# Patient Record
Sex: Male | Born: 1955 | Race: White | Hispanic: No | Marital: Married | Smoking: Never smoker
Health system: Southern US, Community
[De-identification: ages and names within clinical notes are randomized; demographics above are authoritative.]

## PROBLEM LIST (undated history)

## (undated) DIAGNOSIS — M1990 Unspecified osteoarthritis, unspecified site: Secondary | ICD-10-CM

## (undated) DIAGNOSIS — T4145XA Adverse effect of unspecified anesthetic, initial encounter: Secondary | ICD-10-CM

## (undated) DIAGNOSIS — E039 Hypothyroidism, unspecified: Secondary | ICD-10-CM

## (undated) DIAGNOSIS — T8859XA Other complications of anesthesia, initial encounter: Secondary | ICD-10-CM

## (undated) DIAGNOSIS — T883XXA Malignant hyperthermia due to anesthesia, initial encounter: Secondary | ICD-10-CM

## (undated) DIAGNOSIS — K5730 Diverticulosis of large intestine without perforation or abscess without bleeding: Secondary | ICD-10-CM

## (undated) HISTORY — DX: Hypothyroidism, unspecified: E03.9

## (undated) HISTORY — DX: Diverticulosis of large intestine without perforation or abscess without bleeding: K57.30

## (undated) HISTORY — PX: ANTERIOR CRUCIATE LIGAMENT REPAIR: SHX115

---
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
## 1978-10-01 HISTORY — PX: BACK SURGERY: SHX140

## 1982-10-01 HISTORY — PX: KNEE ARTHROSCOPY: SUR90

## 1992-10-01 HISTORY — PX: ANTERIOR CRUCIATE LIGAMENT REPAIR: SHX115

## 1997-05-10 ENCOUNTER — Encounter: Payer: Self-pay | Admitting: Internal Medicine

## 1999-10-02 HISTORY — PX: LASIK: SHX215

## 2002-10-22 ENCOUNTER — Encounter: Payer: Self-pay | Admitting: Internal Medicine

## 2005-11-21 ENCOUNTER — Ambulatory Visit: Payer: Self-pay | Admitting: Internal Medicine

## 2006-02-05 ENCOUNTER — Ambulatory Visit: Payer: Self-pay | Admitting: Family Medicine

## 2006-02-18 ENCOUNTER — Ambulatory Visit: Payer: Self-pay | Admitting: Internal Medicine

## 2006-02-22 ENCOUNTER — Ambulatory Visit: Payer: Self-pay | Admitting: Internal Medicine

## 2006-03-07 ENCOUNTER — Ambulatory Visit: Payer: Self-pay | Admitting: Internal Medicine

## 2006-03-07 LAB — HM COLONOSCOPY

## 2008-10-01 HISTORY — PX: NASAL SINUS SURGERY: SHX719

## 2008-10-21 ENCOUNTER — Telehealth: Payer: Self-pay | Admitting: Internal Medicine

## 2009-01-12 DIAGNOSIS — K5730 Diverticulosis of large intestine without perforation or abscess without bleeding: Secondary | ICD-10-CM | POA: Insufficient documentation

## 2009-01-12 DIAGNOSIS — E039 Hypothyroidism, unspecified: Secondary | ICD-10-CM | POA: Insufficient documentation

## 2009-01-12 DIAGNOSIS — J309 Allergic rhinitis, unspecified: Secondary | ICD-10-CM | POA: Insufficient documentation

## 2009-01-26 ENCOUNTER — Ambulatory Visit: Payer: Self-pay | Admitting: Internal Medicine

## 2009-01-26 DIAGNOSIS — R5383 Other fatigue: Secondary | ICD-10-CM | POA: Insufficient documentation

## 2009-01-26 DIAGNOSIS — R5381 Other malaise: Secondary | ICD-10-CM | POA: Insufficient documentation

## 2009-01-26 DIAGNOSIS — B079 Viral wart, unspecified: Secondary | ICD-10-CM | POA: Insufficient documentation

## 2009-01-27 LAB — CONVERTED CEMR LAB
ALT: 27 units/L (ref 0–53)
AST: 26 units/L (ref 0–37)
Albumin: 4.1 g/dL (ref 3.5–5.2)
Alkaline Phosphatase: 74 units/L (ref 39–117)
BUN: 17 mg/dL (ref 6–23)
Basophils Absolute: 0 10*3/uL (ref 0.0–0.1)
Basophils Relative: 0.8 % (ref 0.0–3.0)
Bilirubin, Direct: 0.1 mg/dL (ref 0.0–0.3)
CO2: 31 meq/L (ref 19–32)
Calcium: 9.1 mg/dL (ref 8.4–10.5)
Chloride: 105 meq/L (ref 96–112)
Cholesterol: 174 mg/dL (ref 0–200)
Creatinine, Ser: 1.1 mg/dL (ref 0.4–1.5)
Eosinophils Absolute: 0.5 10*3/uL (ref 0.0–0.7)
Eosinophils Relative: 9.7 % — ABNORMAL HIGH (ref 0.0–5.0)
Free T4: 1 ng/dL (ref 0.6–1.6)
Glucose, Bld: 81 mg/dL (ref 70–99)
HCT: 45.7 % (ref 39.0–52.0)
HDL: 38.5 mg/dL — ABNORMAL LOW (ref >39.00)
Hemoglobin: 16.2 g/dL (ref 13.0–17.0)
LDL Cholesterol: 107 mg/dL — ABNORMAL HIGH (ref 0–99)
Lymphocytes Relative: 38.2 % (ref 12.0–46.0)
Lymphs Abs: 1.9 10*3/uL (ref 0.7–4.0)
MCHC: 35.5 g/dL (ref 30.0–36.0)
MCV: 94 fL (ref 78.0–100.0)
Monocytes Absolute: 0.4 10*3/uL (ref 0.1–1.0)
Monocytes Relative: 7.7 % (ref 3.0–12.0)
Neutro Abs: 2.1 10*3/uL (ref 1.4–7.7)
Neutrophils Relative %: 43.6 % (ref 43.0–77.0)
PSA: 0.45 ng/mL (ref 0.10–4.00)
Phosphorus: 3.4 mg/dL (ref 2.3–4.6)
Platelets: 251 10*3/uL (ref 150.0–400.0)
Potassium: 4.4 meq/L (ref 3.5–5.1)
RBC: 4.86 M/uL (ref 4.22–5.81)
RDW: 12.5 % (ref 11.5–14.6)
Sodium: 141 meq/L (ref 135–145)
TSH: 2.77 microintl units/mL (ref 0.35–5.50)
Total Bilirubin: 1 mg/dL (ref 0.3–1.2)
Total CHOL/HDL Ratio: 5
Total Protein: 7.1 g/dL (ref 6.0–8.3)
Triglycerides: 142 mg/dL (ref 0.0–149.0)
VLDL: 28.4 mg/dL (ref 0.0–40.0)
WBC: 4.9 10*3/uL (ref 4.5–10.5)

## 2009-08-18 ENCOUNTER — Ambulatory Visit: Payer: Self-pay | Admitting: Family Medicine

## 2009-08-22 ENCOUNTER — Ambulatory Visit: Payer: Self-pay | Admitting: Family Medicine

## 2009-11-09 ENCOUNTER — Ambulatory Visit: Payer: Self-pay | Admitting: Family Medicine

## 2009-11-25 ENCOUNTER — Telehealth: Payer: Self-pay | Admitting: Internal Medicine

## 2010-04-28 ENCOUNTER — Encounter: Admission: RE | Admit: 2010-04-28 | Discharge: 2010-04-28 | Payer: Self-pay | Admitting: Otolaryngology

## 2010-05-30 ENCOUNTER — Ambulatory Visit: Payer: Self-pay | Admitting: Internal Medicine

## 2010-05-31 LAB — CONVERTED CEMR LAB
Albumin: 4.5 g/dL (ref 3.5–5.2)
BUN: 17 mg/dL (ref 6–23)
CO2: 25 meq/L (ref 19–32)
Calcium: 9.2 mg/dL (ref 8.4–10.5)
Chloride: 101 meq/L (ref 96–112)
Creatinine, Ser: 1.11 mg/dL (ref 0.40–1.50)
Free T4: 1.35 ng/dL (ref 0.80–1.80)
Glucose, Bld: 89 mg/dL (ref 70–99)
PSA: 0.47 ng/mL (ref 0.10–4.00)
Phosphorus: 3.4 mg/dL (ref 2.3–4.6)
Potassium: 4.6 meq/L (ref 3.5–5.3)
Sodium: 139 meq/L (ref 135–145)
TSH: 4.144 microintl units/mL (ref 0.350–4.500)

## 2010-06-15 ENCOUNTER — Ambulatory Visit (HOSPITAL_COMMUNITY): Admission: RE | Admit: 2010-06-15 | Discharge: 2010-06-15 | Payer: Self-pay | Admitting: Otolaryngology

## 2010-06-15 ENCOUNTER — Encounter (INDEPENDENT_AMBULATORY_CARE_PROVIDER_SITE_OTHER): Payer: Self-pay | Admitting: Otolaryngology

## 2010-10-31 NOTE — Miscellaneous (Signed)
Summary: Consent for removal of splinter under finger nail  Consent for removal of splinter under finger nail   Imported By: Beau Fanny 08/18/2009 15:00:03  _____________________________________________________________________  External Attachment:    Type:   Image     Comment:   External Document

## 2010-10-31 NOTE — Progress Notes (Signed)
Summary: Synthroid  Phone Note Refill Request Message [facility] October 21[facility] 2008 1:30 PM  Refills Requested: Medication #1:  SYNTHROID 125 MCG  TABS Take 1 tablet by mouth once a day.   Last Refilled: 09/14/2008 patient hasn't been here since we've been on EMR, I pulled his paper chart (in your box), don't know if there're any special circumstances that I'm not aware of since it was filled in April 2009.   Method Requested: Electronic Initial call taken by: Mervin Hack CMA,  October 21, 2008 1:31 PM  Follow-up for Phone Call        okay to fill #30 x 2  but he needs to schedule a physical by the end of the 3 months. If he agrees, his chart needs to be preloaded Follow-up by: Cindee Salt MD,  October 21, 2008 2:14 PM      Prescriptions: SYNTHROID 125 MCG  TABS (LEVOTHYROXINE SODIUM) Take 1 tablet by mouth once a day  #30 x 2   Entered by:   Mervin Hack CMA   Authorized by:   Cindee Salt MD   Signed by:   Mervin Hack CMA on 10/21/2008   Method used:   [facility]ronically to        [facility] [phone][facility] (retail)       [address]       [place], [place]  [address]       Ph: [phone] or [phone]       Fax: [phone]   RxID:   [id number]

## 2010-10-31 NOTE — Assessment & Plan Note (Signed)
Summary: RE-CHECK FINGER / LFW   Vital Signs:  Patient profile:   55 year old male Height:      67 inches Weight:      172.50 pounds BMI:     27.11 Temp:     98.1 degrees F oral Pulse rate:   64 / minute Pulse rhythm:   regular BP sitting:   114 / 68  (left arm) Cuff size:   regular  Vitals Entered By: Lewanda Rife LPN (August 22, 2009 11:33 AM)  History of Present Illness: Pt here for one week followup of finger infection. I slightly debrided the sclerotic portin of the distal lasion and out him on Abs. The finger softened up and almost felt normal (no foreign body sensation) and now is becoming more sclerotic and firm again. He has less swelling and less redness vs last exam. He is tolerating the Abs well, w/o sxs and is continuing to soak altho I don't think as often as I would like.  Problems Prior to Update: 1)  Cellulitis and Absss of Unspec Digit (L RING)  (ICD-681.9) 2)  Wart, Viral  (ICD-078.10) 3)  Fatigue  (ICD-780.79) 4)  Preventive Health Care  (ICD-V70.0) 5)  Hypothyroidism  (ICD-244.9) 6)  Diverticulosis, Colon  (ICD-562.10) 7)  Allergic Rhinitis  (ICD-477.9)  Medications Prior to Update: 1)  Synthroid 125 Mcg  Tabs (Levothyroxine Sodium) .... Take 1 Tablet By Mouth Once A Day 2)  Fluticasone Propionate 50 Mcg/act Susp (Fluticasone Propionate) .... 2 Sprays Each Nostril Daily For Allergies 3)  Keflex 500 Mg Caps (Cephalexin) .... One Tab By Mouth 4 Times A Day  Allergies (verified): 1)  ! * Anesthesia Gases  Physical Exam  General:  alert and normal appearance.   Head:  Normocephalic and atraumatic without obvious abnormalities. No apparent alopecia or balding. Eyes:  pupils equal, pupils round, pupils reactive to light, and no optic disk abnormalities.   Skin:  Left ring finger imprroved but mildly sclerotic on the ulnar  distal side, improved redness and swelling.   Impression & Recommendations:  Problem # 1:  CELLULITIS AND ABSSS OF UNSPEC DIGIT (L  RING) (ICD-681.9) Assessment Improved  But still rteactive. Extend Keflex another 10 days and cont aggressive soaking. RTC before Abs gone. His updated medication list for this problem includes:    Keflex 500 Mg Caps (Cephalexin) ..... One tab by mouth 4 times a day  Elevate affected area. Warm moist compresses for 20 minutes every 2 hours while awake. Take antibiotics as directed and take acetaminophen as needed. To be seen in 48-72 hours if no improvement, sooner if worse.  Complete Medication List: 1)  Synthroid 125 Mcg Tabs (Levothyroxine sodium) .... Take 1 tablet by mouth once a day 2)  Fluticasone Propionate 50 Mcg/act Susp (Fluticasone propionate) .... 2 sprays each nostril daily for allergies as needed 3)  Keflex 500 Mg Caps (Cephalexin) .... One tab by mouth 4 times a day  Patient Instructions: 1)  RTC before Abs gone. Prescriptions: KEFLEX 500 MG CAPS (CEPHALEXIN) one tab by mouth 4 times a day  #40 x 0   Entered and Authorized by:   Shaune Leeks MD   Signed by:   Shaune Leeks MD on 08/22/2009   Method used:   [facility]ically to        [facility] [phone]* (retail)       [address] [place]  [place], [place]  [address]       Ph: [phone] or [phone]       Fax: [phone]   RxID:   [id number]   Current Allergies (reviewed today): ! * ANESTHESIA GASES

## 2010-10-31 NOTE — Assessment & Plan Note (Signed)
Summary: RENEW MEDS   Vital Signs:  Patient profile:   55 year old male Weight:      172 pounds Temp:     98.1 degrees F oral BP sitting:   120 / 70  (left arm) Cuff size:   large  Vitals Entered By: Mervin Hack CMA Duncan Dull) (May 30, 2010 9:16 AM) CC: medication refill   History of Present Illness: DOing okay Has some trouble with left sinus Saw Dr Jeananne Rama yesterday Plans surgery soon---?endoscopic  Having some tingling in his teeth Dentist didn't find anything so went to ENT hopefully surgery will help this  Continues on the thyroid med weight stable No skin or hair changes  Allergies: 1)  ! * Anesthesia Gases  Past History:  Past medical, surgical, family and social histories (including risk factors) reviewed for relevance to current acute and chronic problems.  Past Medical History: Reviewed history from 01/12/2009 and no changes required. Allergic rhinitis Diverticulosis, colon Hypothyroidism  Past Surgical History: Reviewed history from 01/12/2009 and no changes required. Lumbar disc-1980 Arthoscopy R knee- 1984 ACL repair L knee Lasik- 2001  Family History: Reviewed history from 01/26/2009 and no changes required. Mom with HTN, DM Dad in good shape DM in maternal aunt Pat uncle with COPD and ?CAD Cancer on both sides in smokers  Social History: Reviewed history from 01/26/2009 and no changes required. Married Never Smoked Alcohol use-yes 2 children Occupation: Engineer, civil (consulting)  Review of Systems       tries to walk and occ jog, rides stationery bike Sleeps okay mood is fine occ constipation  Physical Exam  General:  alert and normal appearance.   Neck:  supple, no masses, no thyromegaly, no carotid bruits, and no cervical lymphadenopathy.   Lungs:  normal respiratory effort, no intercostal retractions, no accessory muscle use, and normal breath sounds.   Heart:  normal rate, regular rhythm, no murmur, and no  gallop.   Abdomen:  soft and non-tender.   Extremities:  no edema Psych:  normally interactive, good eye contact, not anxious appearing, and not depressed appearing.     Impression & Recommendations:  Problem # 1:  HYPOTHYROIDISM (ICD-244.9) Assessment Unchanged  seems to be euthyroid will check labs  His updated medication list for this problem includes:    Synthroid 125 Mcg Tabs (Levothyroxine sodium) .Marland Kitchen... Take 1 tablet by mouth once a day  Labs Reviewed: TSH: 2.77 (01/26/2009)    Chol: 174 (01/26/2009)   HDL: 38.50 (01/26/2009)   LDL: 107 (01/26/2009)   TG: 142.0 (01/26/2009)  Orders: TLB-Renal Function Panel (80069-RENAL) TLB-TSH (Thyroid Stimulating Hormone) (84443-TSH) TLB-T4 (Thyrox), Free [phone])  Complete Medication List: 1)  Synthroid 125 Mcg Tabs (Levothyroxine sodium) .... Take 1 tablet by mouth once a day 2)  Multivitamins Tabs (Multiple vitamin) .... Take 1 tablet by mouth once a day 3)  Fluticasone Propionate 50 Mcg/act Susp (Fluticasone propionate) .... 2 sprays on each side once daily  Other Orders: TLB-PSA (Prostate Specific Antigen) (84153-PSA)  Patient Instructions: 1)  Try miralax for the constipation 2)  Please schedule a follow-up appointment in 1 year for physical Prescriptions: SYNTHROID 125 MCG  TABS (LEVOTHYROXINE SODIUM) Take 1 tablet by mouth once a day Brand medically necessary #30 x 12   Entered and Authorized by:   Cindee Salt MD   Signed by:   Cindee Salt MD on 05/30/2010   Method used:   [facility] to        [id number] (retail)       [address] [place], [place]  [address]       Ph: [phone] or [phone]       Fax: [phone]   RxID:   [id number] SYNTHROID 125 MCG  TABS (LEVOTHYROXINE SODIUM) Take 1 tablet by mouth once a day Brand medically necessary #30 Tablet x 12   Entered by:   Mervin Hack CMA (AAMA)   Authorized by:   Cindee Salt MD   Signed  by:   Mervin Hack CMA (AAMA) on 05/30/2010   Method used:   [facility]ronically to        [facility] [phone]* (retail)       [address] [place], [place]  [address]       Ph: [phone] or [phone]       Fax: [phone]   RxID:   [id number]   Current Allergies (reviewed today): ! * ANESTHESIA GASES  Appended Document: RENEW MEDS

## 2010-10-31 NOTE — Assessment & Plan Note (Signed)
Summary: SPLINTER UNDER FINGER NAIL LEFT RING FINGER/RBH   Vital Signs:  Patient profile:   55 year old male Weight:      171 pounds Temp:     98.3 degrees F oral Pulse rate:   56 / minute Pulse rhythm:   regular BP sitting:   120 / 80  (left arm) Cuff size:   regular  Vitals Entered By: Sydell Axon LPN (August 18, 2009 11:42 AM) CC: ? splinter under fingernail left hand ring finger   History of Present Illness: Pt of Dr Karle Starch here for feeling something under his fingernail. He initially was reaching in  a drawer about two weeks ago and impaled his left ring finger on a sizeable piece of wood that came out with a splinter essentially sticking out along the ulnar margin of the nail. He pulled out a sieable splinter and thought he had gotten it all. The finger got to be sore and slightly swollen and a few days later he expressed another small piece of would from the same lateral margin. Since then the distal phalanx continues slightly swollen and erythematous with a foreign body sensation on the palmar sideof the digit under the lateral margin of the nail. He has had no recent discharge, pussy or otherwise. He otherwise feels well w/o complaint and denies fever or chills.  Problems Prior to Update: 1)  Wart, Viral  (ICD-078.10) 2)  Fatigue  (ICD-780.79) 3)  Preventive Health Care  (ICD-V70.0) 4)  Hypothyroidism  (ICD-244.9) 5)  Diverticulosis, Colon  (ICD-562.10) 6)  Allergic Rhinitis  (ICD-477.9)  Medications Prior to Update: 1)  Synthroid 125 Mcg  Tabs (Levothyroxine Sodium) .... Take 1 Tablet By Mouth Once A Day 2)  Fluticasone Propionate 50 Mcg/act Susp (Fluticasone Propionate) .... 2 Sprays Each Nostril Daily For Allergies  Allergies (verified): No Known Drug Allergies  Physical Exam  General:  alert and normal appearance.   Head:  Normocephalic and atraumatic without obvious abnormalities. No apparent alopecia or balding. Eyes:  pupils equal, pupils round, pupils  reactive to light, and no optic disk abnormalities.   Extremities:  Left ring finger swollen with some proud flesh on the ulnar side of the ring finger nail distally. Also signif sclerotic tissue along the nail , thicker at the distal end. Erythema encompassing the entire lateral side of the distal phalanx, no real warmth. Debrided sclerotic skin distally of the lateral nail w/o anesthesia. No FB seen. Redness and proud flesh gave appearanc of ingrown nail or hangnail inflammation without invilvement of the nail itself.   Impression & Recommendations:  Problem # 1:  CELLULITIS AND ABSSS OF UNSPEC DIGIT (L RING) (ICD-681.9) Assessment New  Debrided sclerotic tissue. Hot soaks frequently. Clean and dry otherwise. Keflex coverage. RTC Mon for reassess. His updated medication list for this problem includes:    Keflex 500 Mg Caps (Cephalexin) ..... One tab by mouth 4 times a day  Elevate affected area. Warm moist compresses for 20 minutes every 2 hours while awake. Take antibiotics as directed and take acetaminophen as needed. To be seen in 48-72 hours if no improvement, sooner if worse.  Complete Medication List: 1)  Synthroid 125 Mcg Tabs (Levothyroxine sodium) .... Take 1 tablet by mouth once a day 2)  Fluticasone Propionate 50 Mcg/act Susp (Fluticasone propionate) .... 2 sprays each nostril daily for allergies 3)  Keflex 500 Mg Caps (Cephalexin) .... One tab by mouth 4 times a day  Patient Instructions: 1)  RTC Mon for recheck. Prescriptions:  KEFLEX 500 MG CAPS (CEPHALEXIN) one tab by mouth 4 times a day  #40 x 0   Entered and Authorized by:   Shaune Leeks MD   Signed by:   Shaune Leeks MD on 08/18/2009   Method used:   [facility]ron[facility] to        [facility] [phone]* (retail)       [address] [place], [place]  [address]       Ph: [phone] or [phone]       Fax: [phone]   RxID:   [id number]   Current Allergies (reviewed  today): No known allergies

## 2010-10-31 NOTE — Assessment & Plan Note (Signed)
Summary: sinus infection/CLE   Vital Signs:  Patient profile:   55 year old male Height:      67 inches Weight:      173 pounds BMI:     27.19 Temp:     98.4 degrees F oral Pulse rate:   76 / minute Pulse rhythm:   regular BP sitting:   140 / 72  (left arm) Cuff size:   regular  Vitals Entered By: Delilah Shan CMA Duncan Dull) (November 09, 2009 11:41 AM) CC: ? sinus infection / flu   History of Present Illness: 55 yo with 1 month of sinus congestion pressure.  Seemed to get better and then worse several times. Taking OTC decongestants with no relief of symptoms. This week has felt increased sinus pressure, teeth hurting. Subjective fevers at times, no chills. Dry cough. No wheezing or SOB. No sore throat, ears popping.  Current Medications (verified): 1)  Synthroid 125 Mcg  Tabs (Levothyroxine Sodium) .... Take 1 Tablet By Mouth Once A Day 2)  Multivitamins   Tabs (Multiple Vitamin) .... Take 1 Tablet By Mouth Once A Day 3)  Augmentin 875-125 Mg Tabs (Amoxicillin-Pot Clavulanate) .Marland Kitchen.. 1 By Mouth 2 Times Daily X 10 Days  Allergies: 1)  ! * Anesthesia Gases  Review of Systems      See HPI General:  Complains of fever; denies chills and malaise. ENT:  Complains of earache, nasal congestion, postnasal drainage, and sinus pressure; denies difficulty swallowing, ear discharge, and sore throat. CV:  Denies chest pain or discomfort. Resp:  Complains of cough; denies shortness of breath, sputum productive, and wheezing. GI:  Denies diarrhea, nausea, and vomiting.  Physical Exam  General:  alert and normal appearance.   Non toxic, sounds congestion. Afebrile. Ears:  TMs retracted bilaterally, clear fluid. Nose:  nasal dischargemucosal pallor.   Maxillary sinuses tender bilaterally. Mouth:  no erythema and no lesions.   Lungs:  normal respiratory effort and normal breath sounds.   Heart:  normal rate, regular rhythm, no murmur, and no gallop.   Extremities:  no edema Skin:   Intact without suspicious lesions or rashes Cervical Nodes:  No lymphadenopathy noted Psych:  normally interactive, good eye contact, not anxious appearing, and not depressed appearing.     Impression & Recommendations:  Problem # 1:  ACUTE MAXILLARY SINUSITIS (ICD-461.0) Assessment New with symptoms lasting almost 1 month, likely bacterial. Treat with 10 day course of Augmentin. Supportive care as per patient instructions. The following medications were removed from the medication list:    Fluticasone Propionate 50 Mcg/act Susp (Fluticasone propionate) .Marland Kitchen... 2 sprays each nostril daily for allergies as needed    Keflex 500 Mg Caps (Cephalexin) ..... One tab by mouth 4 times a day His updated medication list for this problem includes:    Augmentin 875-125 Mg Tabs (Amoxicillin-pot clavulanate) .Marland Kitchen... 1 by mouth 2 times daily x 10 days  Complete Medication List: 1)  Synthroid 125 Mcg Tabs (Levothyroxine sodium) .... Take 1 tablet by mouth once a day 2)  Multivitamins Tabs (Multiple vitamin) .... Take 1 tablet by mouth once a day 3)  Augmentin 875-125 Mg Tabs (Amoxicillin-pot clavulanate) .Marland Kitchen.. 1 by mouth 2 times daily x 10 days  Patient Instructions: 1)  Take antibiotic as directed.  Drink lots of fluids.  Treat sympotmatically with Mucinex, nasal saline irrigation, and Tylenol/Ibuprofen.You can use warm compresses.  Cough suppressant at night. Call if not improving as expected in 5-7 days.  Prescriptions: AUGMENTIN 875-125 MG TABS (  AMOXICILLIN-POT CLAVULANATE) 1 by mouth 2 times daily x 10 days  #20 x 0   Entered and Authorized by:   Ruthe Mannan MD   Signed by:   Ruthe Mannan MD on 11/09/2009   Method used:   [facility]ronically to        [facility] [phone]* (retail)       [address] [place], [place]  [address]       Ph: [phone] or [phone]       Fax: [phone]   RxID:   [id number]   Current Allergies (reviewed today): ! * ANESTHESIA GASES

## 2010-10-31 NOTE — Progress Notes (Signed)
Summary: asks for refill on antibiotic  Phone Note Call from Patient Call back at [phone]   Caller: Patient Summary of Call: Pt was seen on 11/09/09 and given augmentin for maxillary sinusitis.  He got much better but he says he feels like the sxs are coming back- feels a tingling or soreness and he is asking for a refill on abx. He says he has had this for awhile, will get better but then gets worse again.  [facility] [address] Initial call taken by: Lowella Petties CMA,  November 25, 2009 11:07 AM  Follow-up for Phone Call        okay to refill #20 x 0 to take for another 10 days Follow-up by: Cindee Salt MD,  November 25, 2009 11:09 AM  Additional Follow-up for Phone Call Additional follow up Details #1::        Rx Called In, Spoke with patient and advised results.  Additional Follow-up by: Mervin Hack CMA Duncan Dull),  November 25, 2009 12:22 PM    New/Updated Medications: AMOXICILLIN-POT CLAVULANATE 875-125 MG TABS (AMOXICILLIN-POT CLAVULANATE) take 1 by mouth 2 times daily x 10 days Prescriptions: AMOXICILLIN-POT CLAVULANATE 875-125 MG TABS (AMOXICILLIN-POT CLAVULANATE) take 1 by mouth 2 times daily x 10 days  #20 x 0   Entered by:   Mervin Hack CMA (AAMA)   Authorized by:   Cindee Salt MD   Signed by:   Mervin Hack CMA (AAMA) on 11/25/2009   Method used:   [facility] to        [facility] [phone]* (retail)       [address] [place], [place]  [address]       Ph: [phone] or [phone]       Fax: [phone]   RxID:   [id number]

## 2010-10-31 NOTE — Assessment & Plan Note (Signed)
Summary: CPX/CLE   Vital Signs:  Patient profile:   55 year old male Height:      67 inches Weight:      174 pounds BMI:     27.35 Temp:     98 degrees F oral Pulse rate:   70 / minute Pulse rhythm:   regular BP sitting:   112 / 68  (left arm) Cuff size:   regular  Vitals Entered By: Mervin Hack CMA (January 26, 2009 8:29 AM)  History of Present Illness: Chief Complaint--- adult physical  doing fairly well  Does note that "I am tired" has tried some vitamins mild constipation Doesn't get enough sleep---work, family, etc  something on left plantar foot some pain  Past History:  Past medical, surgical, family and social histories (including risk factors) reviewed for relevance to current acute and chronic problems.  Past Medical History:    Reviewed history from 01/12/2009 and no changes required:    Allergic rhinitis    Diverticulosis, colon    Hypothyroidism  Past Surgical History:    Reviewed history from 01/12/2009 and no changes required:    Lumbar disc-1980    Arthoscopy R knee- 1984    ACL repair L knee    Lasik- 2001  Family History:    Mom with HTN, DM    Dad in good shape    DM in maternal aunt    Pat uncle with COPD and ?CAD    Cancer on both sides in smokers      Social History:    Reviewed history from 01/12/2009 and no changes required:       Married       Never Smoked       Alcohol use-yes       2 children       Occupation: Engineer, civil (consulting)  Review of Systems General:  weight stable sleeps well when he gets there wears seat belt Takes [employer] vitamin pack Tries to walk. Eyes:  Denies double vision and vision loss-1 eye; due for eye exam. ENT:  Denies decreased hearing and ringing in ears; Teeth okay--sees dentist. CV:  Denies chest pain or discomfort, difficulty breathing at night, difficulty breathing while lying down, fainting, lightheadness, palpitations, and shortness of breath with exertion. Resp:  Denies cough and  shortness of breath. GI:  Complains of constipation; denies abdominal pain, bloody stools, dark tarry stools, indigestion, nausea, and vomiting; tries [place] and this seems to help. GU:  Denies erectile dysfunction, urinary frequency, and urinary hesitancy. MS:  Complains of joint pain; denies joint swelling; some knee pain prior ACL repairs bilaterally. Derm:  Complains of lesion(s); denies rash; spot on left temple and right occiput. Neuro:  Denies headaches, numbness, tingling, and weakness. Psych:  Denies anxiety and depression. Heme:  Denies abnormal bruising and enlarge lymph nodes. Allergy:  itchy ears also Mild other symptoms--uses claritin and nasonex in spring.  Physical Exam  General:  alert and normal appearance.   Eyes:  pupils equal, pupils round, pupils reactive to light, and no optic disk abnormalities.   Ears:  R ear normal and L ear normal.   Mouth:  no erythema and no lesions.   Neck:  supple, no masses, no thyromegaly, no carotid bruits, and no cervical lymphadenopathy.   Lungs:  normal respiratory effort and normal breath sounds.   Heart:  normal rate, regular rhythm, no murmur, and no gallop.   Abdomen:  soft, non-tender, and no masses.   Rectal:  no hemorrhoids and no masses.   Prostate:  no gland enlargement and no nodules.   Msk:  no joint tenderness and no joint swelling.   Pulses:  1+ in feet Extremities:  no edema Neurologic:  alert & oriented X3, strength normal in all extremities, and gait normal.   Skin:  plantar wart lateral left foot ?actinic on left temple benign dark nevus on left cheek early lesion on right occiput--appears benign Axillary Nodes:  No palpable lymphadenopathy Psych:  normally interactive, good eye contact, not anxious appearing, and not depressed appearing.     Impression & Recommendations:  Problem # 1:  PREVENTIVE HEALTH CARE (ICD-V70.0) Assessment Comment Only  healthy discussed increasing exercise Tdap  today PSA  Orders: TLB-Lipid Panel (80061-LIPID)  Problem # 2:  FATIGUE (ICD-780.79) Assessment: New  probably related to lack of sleep will check labs though  Orders: TLB-Renal Function Panel (80069-RENAL) TLB-CBC Platelet - w/Differential (85025-CBCD) TLB-Hepatic/Liver Function Pnl (80076-HEPATIC)  Problem # 3:  ALLERGIC RHINITIS (ICD-477.9) Assessment: Comment Only change to generic spray and continue OTC meds as needed   His updated medication list for this problem includes:    Fluticasone Propionate 50 Mcg/act Susp (Fluticasone propionate) .Marland Kitchen... 2 sprays each nostril daily for allergies  Problem # 4:  HYPOTHYROIDISM (ICD-244.9) Assessment: Comment Only  will check labs will need refill if okay  His updated medication list for this problem includes:    Synthroid 125 Mcg Tabs (Levothyroxine sodium) .Marland Kitchen... Take 1 tablet by mouth once a day  Orders: TLB-T4 (Thyrox), Free [phone]) TLB-TSH (Thyroid Stimulating Hormone) (84443-TSH) Venipuncture (95621)  Problem # 5:  WART, VIRAL (ICD-078.10)  treated plantar wart he preferred to wait on the others he has seen Dr Purcell Nails in the past also  Orders: Wart Destruct <14 (17110)  Complete Medication List: 1)  Synthroid 125 Mcg Tabs (Levothyroxine sodium) .... Take 1 tablet by mouth once a day 2)  Fluticasone Propionate 50 Mcg/act Susp (Fluticasone propionate) .... 2 sprays each nostril daily for allergies  Other Orders: TLB-PSA (Prostate Specific Antigen) (84153-PSA) Tdap => 34yrs IM (30865) Admin 1st Vaccine (78469)  Patient Instructions: 1)  Please schedule a follow-up appointment in 1 year.  Prescriptions: FLUTICASONE PROPIONATE 50 MCG/ACT SUSP (FLUTICASONE PROPIONATE) 2 sprays each nostril daily for allergies  #1 x 11   Entered and Authorized by:   Cindee Salt MD   Signed by:   Cindee Salt MD on 01/26/2009   Method used:   [facility] to        [facility] [phone]* (retail)       [address] [place], [place]  [address]       Ph: [phone] or [phone]       Fax: [phone]   RxID:   [id number]         Immunizations Administered:  Tetanus Vaccine:    Vaccine Type: Tdap    Site: left deltoid    Mfr: GlaxoSmithKline    Dose: 0.5 ml    Route: IM    Given by: Mervin Hack CMA    Exp. Date: 11/24/2010    Lot #: [id number]    VIS given: 08/19/07 version given January 26, 2009.    Physician counseled: yes

## 2010-11-06 ENCOUNTER — Encounter: Payer: Self-pay | Admitting: Family Medicine

## 2010-11-06 ENCOUNTER — Ambulatory Visit (INDEPENDENT_AMBULATORY_CARE_PROVIDER_SITE_OTHER): Payer: PRIVATE HEALTH INSURANCE | Admitting: Family Medicine

## 2010-11-06 DIAGNOSIS — J019 Acute sinusitis, unspecified: Secondary | ICD-10-CM

## 2010-11-16 NOTE — Assessment & Plan Note (Signed)
Summary: ??sinus infection/alc   Vital Signs:  Patient profile:   55 year old male Height:      67 inches Weight:      175.75 pounds BMI:     27.63 Temp:     98.5 degrees F oral Pulse rate:   56 / minute Pulse rhythm:   regular BP sitting:   124 / 86  (left arm) Cuff size:   large  Vitals Entered By: Selena Batten Dance CMA Duncan Dull) (November 06, 2010 4:01 PM) CC: ? Sinus infection (symptoms x2 weeks)   History of Present Illness: CC: sinuses?  2 wk h/o draining sinuses, sinus headache.  Tried advil cold and sinus, mucinex not helping.  + mild drainage from nose, yellow.  + subjective fever initially.  + sinus congestion and drainage, + PNDrip.  + ST.  No abd pain, n/v/d, fevers/chills, rashes, myalgia, arthralgia,   No smokers at home, no sick contacts.  No h/o asthma, mild allergies.  h/o sinusitis in past, s/p sinus surgery 07/2009, had done well since then.  Current Medications (verified): 1)  Synthroid 125 Mcg  Tabs (Levothyroxine Sodium) .... Take 1 Tablet By Mouth Once A Day 2)  Multivitamins   Tabs (Multiple Vitamin) .... Take 1 Tablet By Mouth Once A Day 3)  Fluticasone Propionate 50 Mcg/act Susp (Fluticasone Propionate) .... 2 Sprays On Each Side Once Daily  Allergies: 1)  ! * Anesthesia Gases  Past History:  Past Medical History: Last updated: 01/12/2009 Allergic rhinitis Diverticulosis, colon Hypothyroidism  Social History: Last updated: 01/26/2009 Married Never Smoked Alcohol use-yes 2 children Occupation: Engineer, civil (consulting)  Review of Systems       per HPI  Physical Exam  General:  alert and normal appearance.   Head:  Normocephalic and atraumatic without obvious abnormalities. No apparent alopecia or balding.  mild maxillary sinus tenderness Eyes:  pupils equal, pupils round, pupils reactive to light Ears:  TMs clear bilaterally Nose:  some dry discharge bilaterally Mouth:  no erythema and no lesions.   Neck:  supple, no masses, no  thyromegaly, no carotid bruits, and no cervical lymphadenopathy.   Lungs:  normal respiratory effort, no intercostal retractions, no accessory muscle use, and normal breath sounds.   Heart:  normal rate, regular rhythm, no murmur, and no gallop.   Pulses:  2+ rad pulses, brisk cap refill Extremities:  no pedal edema   Impression & Recommendations:  Problem # 1:  SINUSITIS, ACUTE (ICD-461.9) going on >2 wks.  treat with amox.  supportive care as per instructions, red flags to return discussed. His updated medication list for this problem includes:    Fluticasone Propionate 50 Mcg/act Susp (Fluticasone propionate) .Marland Kitchen... 2 sprays on each side once daily    Amoxicillin 875 Mg Tabs (Amoxicillin) .Marland Kitchen... Take one by mouth two times a day x 10 days  Complete Medication List: 1)  Synthroid 125 Mcg Tabs (Levothyroxine sodium) .... Take 1 tablet by mouth once a day 2)  Multivitamins Tabs (Multiple vitamin) .... Take 1 tablet by mouth once a day 3)  Fluticasone Propionate 50 Mcg/act Susp (Fluticasone propionate) .... 2 sprays on each side once daily 4)  Amoxicillin 875 Mg Tabs (Amoxicillin) .... Take one by mouth two times a day x 10 days  Patient Instructions: 1)  You have a sinus infection. 2)  Take medicines as prescribed: amoxicillin twice daily for 10 days 3)  Continue mucinex with fluidto help mobilize mucous. 4)  Use nasal saline spray or neti pot  to help drainage of sinuses. 5)  restart flonase regularly for inflammation. 6)  If you start having fevers >101.5, trouble swallowing or breathing, or are worsening instead of improving as expected, you may need to be seen again. 7)  Good to see you today, call clinic with questions.  Prescriptions: AMOXICILLIN 875 MG TABS (AMOXICILLIN) take one by mouth two times a day x 10 days  #20 x 0   Entered and Authorized by:   Eustaquio Boyden  MD   Signed by:   Eustaquio Boyden  MD on 11/06/2010   Method used:   [facility] to        [facility] [phone]* (retail)       [address] [place], [place]  [address]       Ph: [phone] or [phone]       Fax: [phone]   RxID:   [id number]    Orders Added: 1)  Est. Patient Level III [36644]    Current Allergies (reviewed today): ! * ANESTHESIA GASES

## 2010-12-14 LAB — CBC
HCT: 44.7 % (ref 39.0–52.0)
Hemoglobin: 15.7 g/dL (ref 13.0–17.0)
MCH: 32 pg (ref 26.0–34.0)
MCHC: 35.1 g/dL (ref 30.0–36.0)
MCV: 91 fL (ref 78.0–100.0)
Platelets: 258 10*3/uL (ref 150–400)
RBC: 4.91 MIL/uL (ref 4.22–5.81)
RDW: 13.1 % (ref 11.5–15.5)
WBC: 4.6 10*3/uL (ref 4.0–10.5)

## 2010-12-14 LAB — BASIC METABOLIC PANEL
BUN: 13 mg/dL (ref 6–23)
CO2: 30 mEq/L (ref 19–32)
Calcium: 9.3 mg/dL (ref 8.4–10.5)
Chloride: 103 mEq/L (ref 96–112)
Creatinine, Ser: 1.13 mg/dL (ref 0.4–1.5)
GFR calc Af Amer: >60 mL/min (ref >60)
GFR calc non Af Amer: >60 mL/min (ref >60)
Glucose, Bld: 94 mg/dL (ref 70–99)
Potassium: 4.3 mEq/L (ref 3.5–5.1)
Sodium: 139 mEq/L (ref 135–145)

## 2010-12-14 LAB — SURGICAL PCR SCREEN
MRSA, PCR: NEGATIVE
Staphylococcus aureus: POSITIVE — AB

## 2010-12-14 LAB — CK: Total CK: 114 U/L (ref 7–232)

## 2011-05-24 ENCOUNTER — Ambulatory Visit (INDEPENDENT_AMBULATORY_CARE_PROVIDER_SITE_OTHER)
Admission: RE | Admit: 2011-05-24 | Discharge: 2011-05-24 | Disposition: A | Discharge status: Home / Self Care | Payer: BC Managed Care – PPO | Source: Ambulatory Visit | Attending: Family Medicine | Admitting: Family Medicine

## 2011-05-24 ENCOUNTER — Encounter: Payer: Self-pay | Admitting: Internal Medicine

## 2011-05-24 ENCOUNTER — Encounter: Payer: Self-pay | Admitting: Family Medicine

## 2011-05-24 ENCOUNTER — Ambulatory Visit (INDEPENDENT_AMBULATORY_CARE_PROVIDER_SITE_OTHER): Payer: BC Managed Care – PPO | Admitting: Family Medicine

## 2011-05-24 VITALS — BP 118/70 | HR 52 | Temp 98.0°F | Ht 66.0 in | Wt 176.8 lb

## 2011-05-24 DIAGNOSIS — R05 Cough: Secondary | ICD-10-CM | POA: Insufficient documentation

## 2011-05-24 DIAGNOSIS — R059 Cough, unspecified: Secondary | ICD-10-CM

## 2011-05-24 MED ORDER — LEVOTHYROXINE SODIUM 125 MCG PO TABS: 125.0000 ug | ORAL_TABLET | Freq: Every day | ORAL | Status: DC

## 2011-05-24 NOTE — Progress Notes (Signed)
  Subjective:    Patient ID: Shawn Phillips, male    DOB: [birth year], 55 y.o.   MRN: [id number]  HPI CC: cough  1 wk h/o dry cough.  Started wet, now no longer.  Started with sinus drainage, not anymore.  Has tried cough drops.  Not really bothering him at night, taking some nyquil.  No nasal congestion, ST, HA, ear/tooth pain, fevers/chills, abd pain, n/v/d, rashes.  No SOB, chest pain, wheezing. No sick contacts at home.  No smokers at home.  No h/o asthma/copd.  + h/o seasonal allergies.  No GERD sxs.  + fmhx cancer in smokers in the family.  Pt does not smoke.  Wt Readings from Last 3 Encounters:  05/24/11 176 lb 12 oz (80.173 kg)  11/06/10 175 lb 12 oz (79.72 kg)  05/30/10 172 lb (78.019 kg)   Pt requests refill synthroid for next few months while gets set up with Dr. Elbert Ewings for CPE.  Review of Systems Per HPI    Objective:   Physical Exam  Nursing note and vitals reviewed. Constitutional: He appears well-developed and well-nourished. No distress.  HENT:  Head: Normocephalic and atraumatic.  Right Ear: Hearing, tympanic membrane, external ear and ear canal normal.  Left Ear: Hearing, tympanic membrane, external ear and ear canal normal.  Nose: No mucosal edema or rhinorrhea. Right sinus exhibits no maxillary sinus tenderness and no frontal sinus tenderness. Left sinus exhibits no maxillary sinus tenderness and no frontal sinus tenderness.  Mouth/Throat: Uvula is midline, oropharynx is clear and moist and mucous membranes are normal. No oropharyngeal exudate or tonsillar abscesses.       Septal perforation, chronic Pale, dry turbinates  Eyes: Conjunctivae and EOM are normal. Pupils are equal, round, and reactive to light. No scleral icterus.  Neck: Normal range of motion. Neck supple.  Cardiovascular: Normal rate, regular rhythm, normal heart sounds and intact distal pulses.   No murmur heard. Pulmonary/Chest: Effort normal. No respiratory distress. He has no decreased breath  sounds. He has no wheezes. He has rhonchi (right sided). He has rales (right sided).  Musculoskeletal: He exhibits no edema.  Lymphadenopathy:    He has no cervical adenopathy.  Skin: Skin is warm and dry. No rash noted.  Psychiatric: He has a normal mood and affect.          Assessment & Plan:

## 2011-05-24 NOTE — Patient Instructions (Signed)
Chest xray looking clear today. Sounds like you have a viral bronchitis. Antibiotics are not needed for this.  Viral infections usually take 7-10 days to resolve.  The cough can last several weeks to go away. May try mucinex DM with plenty of fluid to help with cough. Push fluids and plenty of rest. Please return if you are not improving as expected, or let us know if you have high fevers (>101.5) or difficulty swallowing or worsening productive cough. Call clinic with questions.  Pleasure to see you today.

## 2011-05-24 NOTE — Assessment & Plan Note (Addendum)
1 wk duration.  Anticipate viral bronchitis, however did hear rales/rhonchi LLL so checked CXR to r/o mass, PNA. CXR looking clear to me. Recommend supportive care.  See pt instructions.

## 2011-07-08 ENCOUNTER — Other Ambulatory Visit: Payer: Self-pay | Admitting: Internal Medicine

## 2011-08-27 ENCOUNTER — Encounter: Payer: Self-pay | Admitting: Internal Medicine

## 2011-08-27 ENCOUNTER — Ambulatory Visit (INDEPENDENT_AMBULATORY_CARE_PROVIDER_SITE_OTHER): Payer: BC Managed Care – PPO | Admitting: Internal Medicine

## 2011-08-27 VITALS — BP 133/72 | HR 52 | Temp 97.9°F | Ht 66.0 in | Wt 172.0 lb

## 2011-08-27 DIAGNOSIS — Z0000 Encounter for general adult medical examination without abnormal findings: Secondary | ICD-10-CM

## 2011-08-27 DIAGNOSIS — E039 Hypothyroidism, unspecified: Secondary | ICD-10-CM

## 2011-08-27 DIAGNOSIS — J309 Allergic rhinitis, unspecified: Secondary | ICD-10-CM

## 2011-08-27 MED ORDER — SYNTHROID 125 MCG PO TABS: 125.0000 ug | ORAL_TABLET | Freq: Every day | ORAL | Status: DC

## 2011-08-27 MED ORDER — FLUTICASONE PROPIONATE 50 MCG/ACT NA SUSP: 2.0000 | Freq: Every day | NASAL | Status: DC

## 2011-08-27 NOTE — Assessment & Plan Note (Signed)
On med Due for labs 

## 2011-08-27 NOTE — Assessment & Plan Note (Signed)
Fairly healthy Weight stable Does some exercise Will check PSA after discussion Due for colon in 2017 Doesn't want flu shot

## 2011-08-27 NOTE — Assessment & Plan Note (Signed)
Uses antihistamines occ uses flonase

## 2011-08-27 NOTE — Progress Notes (Signed)
  Subjective:    Patient ID: Shawn Phillips, male    DOB: 24-Nov-1955, 55 y.o.   MRN: [id number]  HPI Doing fine No new concerns  Had sinus surgery last year Doing okay from this  Trying to go to gym and walk regularly  Doesn't want flu shot  Not sure about PSA screening--had heard about some controversy   Review of Systems  Constitutional: Negative for activity change, fatigue and unexpected weight change.       No seat belt  HENT: Positive for congestion and rhinorrhea. Negative for hearing loss, dental problem and tinnitus.        Seaasonal allergies Uses OTC prn Keeps up with dentist  Eyes: Negative for visual disturbance.       No diplopia or unilateral vision loss Uses contacts  Respiratory: Negative for cough, chest tightness and shortness of breath.   Cardiovascular: Negative for chest pain, palpitations and leg swelling.  Gastrointestinal: Positive for constipation. Negative for nausea, vomiting, abdominal pain and blood in stool.       Occ mild heartburn---no meds. Better with increased exercise No heartburn  Genitourinary: Negative for dysuria, urgency, frequency and difficulty urinating.       Nocturia x 1  Musculoskeletal: Positive for arthralgias. Negative for back pain and joint swelling.       Occ knee pain--no meds needed (past ACL tears)  Skin: Negative for rash.       Has small raised lesion on abdominal wall Does see derm once a year  Neurological: Negative for dizziness, syncope, weakness, light-headedness and headaches.  Hematological: Negative for adenopathy. Does not bruise/bleed easily.  Psychiatric/Behavioral: Negative for sleep disturbance and dysphoric mood. The patient is not nervous/anxious.        Objective:   Physical Exam  Constitutional: He is oriented to person, place, and time. He appears well-developed and well-nourished. No distress.  HENT:  Head: Normocephalic and atraumatic.  Right Ear: External ear normal.  Left Ear: External  ear normal.  Mouth/Throat: Oropharynx is clear and moist. No oropharyngeal exudate.       TMs normal  Eyes: Conjunctivae and EOM are normal. Pupils are equal, round, and reactive to light.  Neck: Normal range of motion. Neck supple. No thyromegaly present.  Cardiovascular: Normal rate, regular rhythm, normal heart sounds and intact distal pulses.  Exam reveals no gallop.   No murmur heard. Pulmonary/Chest: Effort normal and breath sounds normal. No respiratory distress. He has no wheezes. He has no rales.  Abdominal: Soft. There is no tenderness.  Musculoskeletal: Normal range of motion. He exhibits no edema and no tenderness.  Lymphadenopathy:    He has no cervical adenopathy.  Neurological: He is alert and oriented to person, place, and time.       Normal gait and strength  Skin: Skin is warm. No rash noted.       Several tiny cherry angiomas on abd ?small skin tag is the lesion ---looks totally benign  Psychiatric: He has a normal mood and affect. His behavior is normal. Judgment and thought content normal.          Assessment & Plan:

## 2011-08-28 LAB — CBC WITH DIFFERENTIAL/PLATELET
Basophils Absolute: 0 10*3/uL (ref 0.0–0.1)
Basophils Relative: 0.3 % (ref 0.0–3.0)
Eosinophils Absolute: 0.3 10*3/uL (ref 0.0–0.7)
Eosinophils Relative: 5.3 % — ABNORMAL HIGH (ref 0.0–5.0)
HCT: 46 % (ref 39.0–52.0)
Hemoglobin: 15.7 g/dL (ref 13.0–17.0)
Lymphocytes Relative: 51.4 % — ABNORMAL HIGH (ref 12.0–46.0)
Lymphs Abs: 2.5 10*3/uL (ref 0.7–4.0)
MCHC: 34.1 g/dL (ref 30.0–36.0)
MCV: 94.6 fl (ref 78.0–100.0)
Monocytes Absolute: 0.4 10*3/uL (ref 0.1–1.0)
Monocytes Relative: 7.3 % (ref 3.0–12.0)
Neutro Abs: 1.7 10*3/uL (ref 1.4–7.7)
Neutrophils Relative %: 35.7 % — ABNORMAL LOW (ref 43.0–77.0)
Platelets: 260 10*3/uL (ref 150.0–400.0)
RBC: 4.87 Mil/uL (ref 4.22–5.81)
RDW: 13.3 % (ref 11.5–14.6)
WBC: 4.8 10*3/uL (ref 4.5–10.5)

## 2011-08-28 LAB — HEPATIC FUNCTION PANEL
ALT: 24 U/L (ref 0–53)
AST: 27 U/L (ref 0–37)
Albumin: 4.4 g/dL (ref 3.5–5.2)
Alkaline Phosphatase: 78 U/L (ref 39–117)
Bilirubin, Direct: 0.2 mg/dL (ref 0.0–0.3)
Total Bilirubin: 1.1 mg/dL (ref 0.3–1.2)
Total Protein: 7.1 g/dL (ref 6.0–8.3)

## 2011-08-28 LAB — BASIC METABOLIC PANEL
BUN: 14 mg/dL (ref 6–23)
CO2: 27 mEq/L (ref 19–32)
Calcium: 9.4 mg/dL (ref 8.4–10.5)
Chloride: 105 mEq/L (ref 96–112)
Creatinine, Ser: 1.3 mg/dL (ref 0.4–1.5)
GFR: 62.99 mL/min (ref >60.00)
Glucose, Bld: 86 mg/dL (ref 70–99)
Potassium: 4.1 mEq/L (ref 3.5–5.1)
Sodium: 140 mEq/L (ref 135–145)

## 2011-08-28 LAB — LIPID PANEL
Cholesterol: 175 mg/dL (ref 0–200)
HDL: 46.3 mg/dL (ref >39.00)
LDL Cholesterol: 116 mg/dL — ABNORMAL HIGH (ref 0–99)
Total CHOL/HDL Ratio: 4
Triglycerides: 64 mg/dL (ref 0.0–149.0)
VLDL: 12.8 mg/dL (ref 0.0–40.0)

## 2011-08-28 LAB — TSH: TSH: 1.63 u[IU]/mL (ref 0.35–5.50)

## 2011-08-28 LAB — PSA: PSA: 0.43 ng/mL (ref 0.10–4.00)

## 2011-08-28 MED ORDER — SYNTHROID 125 MCG PO TABS: 125.0000 ug | ORAL_TABLET | Freq: Every day | ORAL | Status: DC

## 2011-08-28 MED ORDER — FLUTICASONE PROPIONATE 50 MCG/ACT NA SUSP: 2.0000 | Freq: Every day | NASAL | Status: AC

## 2011-08-28 NOTE — Progress Notes (Signed)
Addended by: Lillianne Eick L on: 08/28/2011 07:58 AM   Modules accepted: Orders  

## 2011-08-29 LAB — T4, FREE: Free T4: 1.13 ng/dL (ref 0.60–1.60)

## 2011-10-01 ENCOUNTER — Other Ambulatory Visit: Payer: Self-pay | Admitting: Family Medicine

## 2011-10-01 NOTE — Telephone Encounter (Signed)
Will route to PCP 

## 2011-11-19 ENCOUNTER — Telehealth: Payer: Self-pay | Admitting: Internal Medicine

## 2011-11-19 NOTE — Telephone Encounter (Signed)
Triage Record Num: [id number] Operator: Lodema Pilot Patient Name: Shawn Phillips Call Date & Time: 11/17/2011 9:17:37AM Patient Phone: [phone] PCP: Tillman Abide Patient Gender: Male PCP Fax : [phone] Patient DOB: 08-Apr-1956 Practice Name[facility] Reason for Call: Caller: Noelle/Patient; PCP: Tillman Abide I.; CB#: [phone]; Call regarding Cough/Congestion; Sinus pressure. Cough, nasal congestion - yellow. Afebrile. Onset: x 1 week. Worsened 11/16/11. Moderate headache relieved by Advil Cold and Sinus. Productive cough w/ yellow sputum and "slight wheezing" at times. Per Upper Respiratory Protocol, advised to see MD w/in 24 hours. Offered Sat. [facility] office, but was declined. Pt is on way to work. Pt will go to UC. Care advice given. Protocol(s) Used: Upper Respiratory Infection (URI) Recommended Outcome per Protocol: See Provider within 24 hours Reason for Outcome: Productive cough with colored sputum (other than clear or white sputum) Care Advice: ~ Use a cool mist humidifier to moisten air. Be sure to clean according to manufacturer's instructions. ~ May inhale steam from hot shower or heated water. Be careful to avoid burns. Increase fluids to 8-12 eight oz (1.6 to 2.4 liters) glasses per day, half of them to be water. Soups, popsicles, fruit juices, non-caffeinated sodas (unless restricting sodium intake), jello, broths, decaf teas, etc. are all okay. Warm fluids can be soothing. ~ ~ Warm fluids may help, or try a mixture of honey and lemon juice in warm tea. ~ SYMPTOM / CONDITION MANAGEMENT ~ INFECTION CONTROL ~ CAUTIONS Analgesic/Antipyretic Advice - Acetaminophen: Consider acetaminophen as directed on label or by pharmacist/provider for pain or fever PRECAUTIONS: - Use if there is no history of liver disease, alcoholism, or intake of three or more alcohol drinks per day - Only if approved by provider during pregnancy or when breastfeeding - During  pregnancy, acetaminophen should not be taken more than 3 consecutive days without telling provider - Do not exceed recommended dose or frequency ~ Analgesic/Antipyretic Advice - NSAIDs: Consider aspirin, ibuprofen, naproxen or ketoprofen for pain or fever as directed on label or by pharmacist/provider. PRECAUTIONS: - If over 49 years of age, should not take longer than 1 week without consulting provider. EXCEPTIONS: - Should not be used if taking blood thinners or have bleeding problems. - Do not use if have history of sensitivity/allergy to any of these medications; or history of cardiovascular, ulcer, kidney, liver disease or diabetes unless approved by provider. - Do not exceed recommended dose or frequency. ~ 11/17/2011 9:30:48AM Page 1 of 1 [id number]

## 2011-11-19 NOTE — Telephone Encounter (Signed)
Please check and see how he made out

## 2011-11-21 NOTE — Telephone Encounter (Signed)
Left message on VM asking pt how he was doing and to call us and let us know.

## 2012-02-11 ENCOUNTER — Other Ambulatory Visit: Payer: Self-pay | Admitting: Internal Medicine

## 2012-07-21 ENCOUNTER — Other Ambulatory Visit: Payer: Self-pay | Admitting: Internal Medicine

## 2012-08-19 ENCOUNTER — Other Ambulatory Visit: Payer: Self-pay | Admitting: Internal Medicine

## 2012-09-16 ENCOUNTER — Other Ambulatory Visit: Payer: Self-pay | Admitting: Internal Medicine

## 2012-10-07 ENCOUNTER — Encounter: Payer: BC Managed Care – PPO | Admitting: Internal Medicine

## 2012-10-14 ENCOUNTER — Other Ambulatory Visit: Payer: Self-pay | Admitting: Internal Medicine

## 2012-11-18 ENCOUNTER — Other Ambulatory Visit: Payer: Self-pay | Admitting: Internal Medicine

## 2012-12-16 ENCOUNTER — Other Ambulatory Visit: Payer: Self-pay | Admitting: Internal Medicine

## 2013-01-18 ENCOUNTER — Other Ambulatory Visit: Payer: Self-pay | Admitting: Internal Medicine

## 2013-02-10 ENCOUNTER — Ambulatory Visit (INDEPENDENT_AMBULATORY_CARE_PROVIDER_SITE_OTHER): Payer: BC Managed Care – PPO | Admitting: Internal Medicine

## 2013-02-10 ENCOUNTER — Encounter: Payer: Self-pay | Admitting: Internal Medicine

## 2013-02-10 VITALS — BP 118/70 | HR 58 | Temp 97.7°F | Ht 67.0 in | Wt 171.0 lb

## 2013-02-10 DIAGNOSIS — Z0000 Encounter for general adult medical examination without abnormal findings: Secondary | ICD-10-CM

## 2013-02-10 DIAGNOSIS — E039 Hypothyroidism, unspecified: Secondary | ICD-10-CM

## 2013-02-10 DIAGNOSIS — J309 Allergic rhinitis, unspecified: Secondary | ICD-10-CM

## 2013-02-10 DIAGNOSIS — Z125 Encounter for screening for malignant neoplasm of prostate: Secondary | ICD-10-CM

## 2013-02-10 LAB — T4, FREE: Free T4: 1.22 ng/dL (ref 0.60–1.60)

## 2013-02-10 LAB — HEPATIC FUNCTION PANEL
ALT: 22 U/L (ref 0–53)
AST: 18 U/L (ref 0–37)
Albumin: 4 g/dL (ref 3.5–5.2)
Alkaline Phosphatase: 77 U/L (ref 39–117)
Bilirubin, Direct: 0.2 mg/dL (ref 0.0–0.3)
Total Bilirubin: 0.9 mg/dL (ref 0.3–1.2)
Total Protein: 6.8 g/dL (ref 6.0–8.3)

## 2013-02-10 LAB — CBC WITH DIFFERENTIAL/PLATELET
Basophils Absolute: 0 10*3/uL (ref 0.0–0.1)
Basophils Relative: 0.5 % (ref 0.0–3.0)
Eosinophils Absolute: 0.5 10*3/uL (ref 0.0–0.7)
Eosinophils Relative: 7.2 % — ABNORMAL HIGH (ref 0.0–5.0)
HCT: 45 % (ref 39.0–52.0)
Hemoglobin: 15.7 g/dL (ref 13.0–17.0)
Lymphocytes Relative: 32.2 % (ref 12.0–46.0)
Lymphs Abs: 2.3 10*3/uL (ref 0.7–4.0)
MCHC: 34.8 g/dL (ref 30.0–36.0)
MCV: 91.3 fl (ref 78.0–100.0)
Monocytes Absolute: 0.5 10*3/uL (ref 0.1–1.0)
Monocytes Relative: 6.6 % (ref 3.0–12.0)
Neutro Abs: 3.9 10*3/uL (ref 1.4–7.7)
Neutrophils Relative %: 53.5 % (ref 43.0–77.0)
Platelets: 278 10*3/uL (ref 150.0–400.0)
RBC: 4.93 Mil/uL (ref 4.22–5.81)
RDW: 13.1 % (ref 11.5–14.6)
WBC: 7.3 10*3/uL (ref 4.5–10.5)

## 2013-02-10 LAB — PSA: PSA: 0.42 ng/mL (ref 0.10–4.00)

## 2013-02-10 LAB — BASIC METABOLIC PANEL
BUN: 16 mg/dL (ref 6–23)
CO2: 30 mEq/L (ref 19–32)
Calcium: 9.1 mg/dL (ref 8.4–10.5)
Chloride: 102 mEq/L (ref 96–112)
Creatinine, Ser: 1.1 mg/dL (ref 0.4–1.5)
GFR: 73.29 mL/min (ref >60.00)
Glucose, Bld: 89 mg/dL (ref 70–99)
Potassium: 4.1 mEq/L (ref 3.5–5.1)
Sodium: 137 mEq/L (ref 135–145)

## 2013-02-10 LAB — TSH: TSH: 0.48 u[IU]/mL (ref 0.35–5.50)

## 2013-02-10 NOTE — Assessment & Plan Note (Signed)
Fairly healthy Will check PSA after discussion UTD otherwise Discussed Rx to prevent constipation

## 2013-02-10 NOTE — Patient Instructions (Addendum)
Please try senna-S 2 tabs daily or miralax (polyethylene glycol) 1/2-1 capful with full glass of water daily ----to help the constipation. Please call for appointment with Dr Patsy Lager if your shoulder doesn't complete heal up.

## 2013-02-10 NOTE — Assessment & Plan Note (Signed)
Discussed Rx Just loratadine for now

## 2013-02-10 NOTE — Assessment & Plan Note (Signed)
Euthyroid clinically Will check labs

## 2013-02-10 NOTE — Progress Notes (Signed)
Subjective:    Patient ID: Shawn Phillips, male    DOB: 30-Mar-1956, 57 y.o.   MRN: [id number]  HPI Here for physical  Having some sinus symptoms Uses loratadine prn Hasn't been using the fluticasone spray  Gets some constipation Will note some red blood on the toilet paper if he has to strain Uses fiber pills fairly regularly--works better with increased exercise  Strained right shoulder moving a treadmill Slowly improving over the past month---had trouble moving laterally but better now  Current Outpatient Prescriptions on File Prior to Visit  Medication Sig Dispense Refill  . fluticasone (FLONASE) 50 MCG/ACT nasal spray Place 2 sprays into the nose daily.  16 g  11  . Multiple Vitamin (MULTIVITAMIN) tablet Take 1 tablet by mouth daily.        Marland Kitchen SYNTHROID 125 MCG tablet TAKE 1 TABLET (125 MCG TOTAL) BY MOUTH DAILY.  30 tablet  0   No current facility-administered medications on file prior to visit.    Allergies  Allergen Reactions  . Other Other (See Comments)    Anesthesia gases---malignant hyperthermia    Past Medical History  Diagnosis Date  . Allergic rhinitis   . Diverticulosis of colon   . Hypothyroidism     Past Surgical History  Procedure Laterality Date  . Back surgery  1980    lumbar disk  . Knee arthroscopy  1984    Right  . Anterior cruciate ligament repair      Left  . Lasik  2001  . Nasal sinus surgery  2010    left side, chronic septal perforation    Family History  Problem Relation Age of Onset  . Hypertension Mother   . Diabetes Mother   . Heart disease Mother   . Healthy Father   . Diabetes Maternal Aunt   . COPD Paternal Uncle   . Coronary artery disease Paternal Uncle   . Cancer      Both sides in smokers    History   Social History  . Marital Status: Married    Spouse Name: N/A    Number of Children: 2  . Years of Education: N/A   Occupational History  . Retail     Furniture   Social History Main Topics  . Smoking  status: Never Smoker   . Smokeless tobacco: Never Used  . Alcohol Use: Yes     Comment: Rare  . Drug Use: No  . Sexually Active: Not on file   Other Topics Concern  . 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       Wears seat belt  HENT: Positive for congestion, rhinorrhea and tinnitus. Negative for hearing loss and dental problem.        Regular with dentist  Eyes: Negative for visual disturbance.       No diplopia or unilateral vision loss Wears contacts  Respiratory: Negative for cough, chest tightness and shortness of breath.   Cardiovascular: Negative for chest pain, palpitations and leg swelling.  Gastrointestinal: Positive for constipation. Negative for nausea, vomiting and abdominal pain.       No heartburn Some blood but only on toilet paper  Endocrine: Negative for cold intolerance and heat intolerance.  Genitourinary: Negative for urgency, frequency and difficulty urinating.  No sexual problems  Skin: Negative for rash.       No suspicious areas Sees dermatologist  Allergic/Immunologic: Positive for environmental allergies. Negative for immunocompromised state.  Neurological: Negative for dizziness, syncope, light-headedness and headaches.  Hematological: Negative for adenopathy. Does not bruise/bleed easily.  Psychiatric/Behavioral: Negative for sleep disturbance and dysphoric mood. The patient is not nervous/anxious.        Generally sleeps okay---?some degree of restless legs symptoms lately       Objective:   Physical Exam  Constitutional: He is oriented to person, place, and time. He appears well-developed and well-nourished. No distress.  HENT:  Head: Normocephalic and atraumatic.  Right Ear: External ear normal.  Left Ear: External ear normal.  Mouth/Throat: Oropharynx is clear and moist. No  oropharyngeal exudate.  Eyes: Conjunctivae and EOM are normal. Pupils are equal, round, and reactive to light.  Neck: Normal range of motion. Neck supple. No thyromegaly present.  Cardiovascular: Normal rate, regular rhythm, normal heart sounds and intact distal pulses.  Exam reveals no gallop.   No murmur heard. Pulmonary/Chest: Effort normal and breath sounds normal. No respiratory distress. He has no wheezes. He has no rales.  Abdominal: Soft. There is no tenderness.  Musculoskeletal: He exhibits no edema and no tenderness.  Fairly normal ROM in right shoulder. Mild crepitus. Not tender and no pain with ROM  Lymphadenopathy:    He has no cervical adenopathy.  Neurological: He is alert and oriented to person, place, and time.  Skin: No rash noted. No erythema.  Psychiatric: He has a normal mood and affect. His behavior is normal.          Assessment & Plan:

## 2013-02-18 ENCOUNTER — Other Ambulatory Visit: Payer: Self-pay | Admitting: Internal Medicine

## 2013-08-06 ENCOUNTER — Other Ambulatory Visit: Payer: Self-pay

## 2014-02-08 ENCOUNTER — Other Ambulatory Visit: Payer: Self-pay | Admitting: Internal Medicine

## 2014-05-04 ENCOUNTER — Other Ambulatory Visit: Payer: Self-pay | Admitting: Internal Medicine

## 2014-05-05 ENCOUNTER — Other Ambulatory Visit: Payer: Self-pay | Admitting: Internal Medicine

## 2014-05-05 NOTE — Telephone Encounter (Signed)
Pt left v/m requesting status of refill; advise refill done and pt has already scheduled appt with Dr Silvio Pate.

## 2014-09-14 ENCOUNTER — Encounter: Payer: BC Managed Care – PPO | Admitting: Internal Medicine

## 2014-11-08 DIAGNOSIS — D239 Other benign neoplasm of skin, unspecified: Secondary | ICD-10-CM

## 2014-11-08 HISTORY — DX: Other benign neoplasm of skin, unspecified: D23.9

## 2016-04-24 ENCOUNTER — Encounter: Payer: Self-pay | Admitting: Internal Medicine

## 2018-03-24 DIAGNOSIS — N401 Enlarged prostate with lower urinary tract symptoms: Secondary | ICD-10-CM | POA: Insufficient documentation

## 2018-07-11 DIAGNOSIS — J324 Chronic pansinusitis: Secondary | ICD-10-CM | POA: Insufficient documentation

## 2018-07-11 DIAGNOSIS — J3489 Other specified disorders of nose and nasal sinuses: Secondary | ICD-10-CM | POA: Insufficient documentation

## 2018-08-06 DIAGNOSIS — J301 Allergic rhinitis due to pollen: Secondary | ICD-10-CM | POA: Insufficient documentation

## 2019-08-06 ENCOUNTER — Other Ambulatory Visit: Payer: Self-pay | Admitting: *Deleted

## 2019-08-06 DIAGNOSIS — Z20822 Contact with and (suspected) exposure to covid-19: Secondary | ICD-10-CM

## 2019-08-07 LAB — NOVEL CORONAVIRUS, NAA: SARS-CoV-2, NAA: NOT DETECTED

## 2019-08-22 DIAGNOSIS — M1711 Unilateral primary osteoarthritis, right knee: Secondary | ICD-10-CM | POA: Insufficient documentation

## 2019-08-22 DIAGNOSIS — T883XXA Malignant hyperthermia due to anesthesia, initial encounter: Secondary | ICD-10-CM | POA: Insufficient documentation

## 2019-08-22 DIAGNOSIS — M1712 Unilateral primary osteoarthritis, left knee: Secondary | ICD-10-CM | POA: Insufficient documentation

## 2019-10-15 ENCOUNTER — Encounter
Admission: RE | Admit: 2019-10-15 | Discharge: 2019-10-15 | Disposition: A | Discharge status: Home / Self Care | Payer: BC Managed Care – PPO | Source: Ambulatory Visit | Attending: Orthopedic Surgery | Admitting: Orthopedic Surgery

## 2019-10-15 ENCOUNTER — Other Ambulatory Visit
Admission: RE | Admit: 2019-10-15 | Discharge: 2019-10-15 | Disposition: A | Discharge status: Home / Self Care | Payer: BC Managed Care – PPO | Source: Ambulatory Visit | Attending: Orthopedic Surgery | Admitting: Orthopedic Surgery

## 2019-10-15 ENCOUNTER — Other Ambulatory Visit: Payer: Self-pay

## 2019-10-15 DIAGNOSIS — R001 Bradycardia, unspecified: Secondary | ICD-10-CM | POA: Diagnosis not present

## 2019-10-15 DIAGNOSIS — Z01818 Encounter for other preprocedural examination: Secondary | ICD-10-CM | POA: Insufficient documentation

## 2019-10-15 DIAGNOSIS — R9431 Abnormal electrocardiogram [ECG] [EKG]: Secondary | ICD-10-CM | POA: Insufficient documentation

## 2019-10-15 DIAGNOSIS — I454 Nonspecific intraventricular block: Secondary | ICD-10-CM | POA: Insufficient documentation

## 2019-10-15 HISTORY — DX: Unspecified osteoarthritis, unspecified site: M19.90

## 2019-10-15 HISTORY — DX: Other complications of anesthesia, initial encounter: T88.59XA

## 2019-10-15 HISTORY — DX: Malignant hyperthermia due to anesthesia, initial encounter: T88.3XXA

## 2019-10-15 LAB — COMPREHENSIVE METABOLIC PANEL
ALT: 19 U/L (ref 0–44)
AST: 34 U/L (ref 15–41)
Albumin: 4 g/dL (ref 3.5–5.0)
Alkaline Phosphatase: 68 U/L (ref 38–126)
Anion gap: 7 (ref 5–15)
BUN: 18 mg/dL (ref 8–23)
CO2: 28 mmol/L (ref 22–32)
Calcium: 8.8 mg/dL — ABNORMAL LOW (ref 8.9–10.3)
Chloride: 104 mmol/L (ref 98–111)
Creatinine, Ser: 1.29 mg/dL — ABNORMAL HIGH (ref 0.61–1.24)
GFR calc Af Amer: >60 mL/min (ref >60)
GFR calc non Af Amer: 59 mL/min — ABNORMAL LOW (ref >60)
Glucose, Bld: 96 mg/dL (ref 70–99)
Potassium: 3.9 mmol/L (ref 3.5–5.1)
Sodium: 139 mmol/L (ref 135–145)
Total Bilirubin: 0.6 mg/dL (ref 0.3–1.2)
Total Protein: 7.2 g/dL (ref 6.5–8.1)

## 2019-10-15 LAB — PROTIME-INR
INR: 1.1 (ref 0.8–1.2)
Prothrombin Time: 14.1 seconds (ref 11.4–15.2)

## 2019-10-15 LAB — URINALYSIS, ROUTINE W REFLEX MICROSCOPIC
Bilirubin Urine: NEGATIVE
Glucose, UA: NEGATIVE mg/dL
Hgb urine dipstick: NEGATIVE
Ketones, ur: NEGATIVE mg/dL
Leukocytes,Ua: NEGATIVE
Nitrite: NEGATIVE
Protein, ur: NEGATIVE mg/dL
Specific Gravity, Urine: 1.015 (ref 1.005–1.030)
pH: 7 (ref 5.0–8.0)

## 2019-10-15 LAB — CBC
HCT: 41 % (ref 39.0–52.0)
Hemoglobin: 13.7 g/dL (ref 13.0–17.0)
MCH: 30.2 pg (ref 26.0–34.0)
MCHC: 33.4 g/dL (ref 30.0–36.0)
MCV: 90.3 fL (ref 80.0–100.0)
Platelets: 335 10*3/uL (ref 150–400)
RBC: 4.54 MIL/uL (ref 4.22–5.81)
RDW: 13.4 % (ref 11.5–15.5)
WBC: 6.6 10*3/uL (ref 4.0–10.5)
nRBC: 0 % (ref 0.0–0.2)

## 2019-10-15 LAB — SURGICAL PCR SCREEN
MRSA, PCR: NEGATIVE
Staphylococcus aureus: POSITIVE — AB

## 2019-10-15 LAB — C-REACTIVE PROTEIN: CRP: 0.9 mg/dL (ref <1.0)

## 2019-10-15 LAB — TYPE AND SCREEN
ABO/RH(D): O POS
Antibody Screen: NEGATIVE

## 2019-10-15 LAB — SEDIMENTATION RATE: Sed Rate: 4 mm/hr (ref 0–20)

## 2019-10-15 LAB — APTT: aPTT: 32 seconds (ref 24–36)

## 2019-10-15 NOTE — Patient Instructions (Addendum)
Your procedure is scheduled on: Wednesday, January 20 Report to Day Surgery on the 2nd floor of the [facility]. To find out your arrival time, please call [phone] between 1PM - 3PM on: Tuesday, January 19  REMEMBER: Instructions that are not followed completely may result in serious medical risk, up to and including death; or upon the discretion of your surgeon and anesthesiologist your surgery may need to be rescheduled.  Do not eat food after midnight the night before surgery.  No gum chewing, lozengers or hard candies.  You may however, drink CLEAR liquids up to 2 hours before you are scheduled to arrive for your surgery. Do not drink anything within 2 hours of the start of your surgery.  Clear liquids include: - water  - apple juice without pulp - gatorade - black coffee or tea (Do NOT add milk or creamers to the coffee or tea) Do NOT drink anything that is not on this list.  ENSURE PRE-SURGERY CARBOHYDRATE DRINK:  Complete drinking 3 hours prior to surgery.  No Alcohol for 24 hours before or after surgery.  No Smoking including e-cigarettes for 24 hours prior to surgery.  No chewable tobacco products for at least 6 hours prior to surgery.  No nicotine patches on the day of surgery.  On the morning of surgery brush your teeth with toothpaste and water, you may rinse your mouth with mouthwash if you wish. Do not swallow any toothpaste or mouthwash.  Notify your doctor if there is any change in your medical condition (cold, fever, infection).  Do not wear jewelry, make-up, hairpins, clips or nail polish.  Do not wear lotions, powders, or perfumes.   Do not shave 48 hours prior to surgery.   Contacts and dentures may not be worn into surgery.  Do not bring valuables to the hospital, including drivers license, insurance or credit cards.  [facility] is not responsible for any belongings or valuables.   TAKE THESE MEDICATIONS THE MORNING OF SURGERY:  1.   Synthroid  Use CHG Soap as directed on instruction sheet.  Stop Anti-inflammatories (NSAIDS) such as Advil, Aleve, Ibuprofen, Motrin, Naproxen, Naprosyn and Aspirin based products such as Excedrin, Goodys Powder, BC Powder. (May take Tylenol or Acetaminophen if needed.)  Stop ANY OVER THE COUNTER supplements until after surgery.  Wear comfortable clothing (specific to your surgery type) to the hospital.  Please call [phone] if you have any questions about these instructions.

## 2019-10-16 LAB — URINE CULTURE
Culture: NO GROWTH
Special Requests: NORMAL

## 2019-10-19 ENCOUNTER — Other Ambulatory Visit
Admission: RE | Admit: 2019-10-19 | Discharge: 2019-10-19 | Disposition: A | Discharge status: Home / Self Care | Payer: BC Managed Care – PPO | Source: Ambulatory Visit | Attending: Orthopedic Surgery | Admitting: Orthopedic Surgery

## 2019-10-19 DIAGNOSIS — Z01812 Encounter for preprocedural laboratory examination: Secondary | ICD-10-CM | POA: Insufficient documentation

## 2019-10-19 DIAGNOSIS — Z20822 Contact with and (suspected) exposure to covid-19: Secondary | ICD-10-CM | POA: Insufficient documentation

## 2019-10-19 LAB — SARS CORONAVIRUS 2 (TAT 6-24 HRS): SARS Coronavirus 2: NEGATIVE

## 2019-10-20 MED ORDER — TRANEXAMIC ACID-NACL 1000-0.7 MG/100ML-% IV SOLN
1000.0000 mg | INTRAVENOUS | Status: AC
  Administered 2019-10-21: 1000 mg via INTRAVENOUS

## 2019-10-21 ENCOUNTER — Ambulatory Visit: Payer: BC Managed Care – PPO | Admitting: Anesthesiology

## 2019-10-21 ENCOUNTER — Encounter: Payer: Self-pay | Admitting: Orthopedic Surgery

## 2019-10-21 ENCOUNTER — Ambulatory Visit: Payer: BC Managed Care – PPO

## 2019-10-21 ENCOUNTER — Other Ambulatory Visit: Payer: Self-pay

## 2019-10-21 ENCOUNTER — Encounter
Admission: RE | Disposition: A | Discharge status: Home / Self Care | Payer: Self-pay | Source: Home / Self Care | Attending: Orthopedic Surgery

## 2019-10-21 ENCOUNTER — Ambulatory Visit
Admission: RE | Admit: 2019-10-21 | Discharge: 2019-10-21 | Disposition: A | Discharge status: Home / Self Care | Payer: BC Managed Care – PPO | Attending: Orthopedic Surgery | Admitting: Orthopedic Surgery

## 2019-10-21 DIAGNOSIS — R3914 Feeling of incomplete bladder emptying: Secondary | ICD-10-CM | POA: Diagnosis not present

## 2019-10-21 DIAGNOSIS — M8568 Other cyst of bone, other site: Secondary | ICD-10-CM | POA: Insufficient documentation

## 2019-10-21 DIAGNOSIS — Z79890 Hormone replacement therapy (postmenopausal): Secondary | ICD-10-CM | POA: Insufficient documentation

## 2019-10-21 DIAGNOSIS — M25761 Osteophyte, right knee: Secondary | ICD-10-CM | POA: Insufficient documentation

## 2019-10-21 DIAGNOSIS — M1711 Unilateral primary osteoarthritis, right knee: Secondary | ICD-10-CM | POA: Diagnosis not present

## 2019-10-21 DIAGNOSIS — N401 Enlarged prostate with lower urinary tract symptoms: Secondary | ICD-10-CM | POA: Insufficient documentation

## 2019-10-21 DIAGNOSIS — E039 Hypothyroidism, unspecified: Secondary | ICD-10-CM | POA: Diagnosis not present

## 2019-10-21 DIAGNOSIS — Z79899 Other long term (current) drug therapy: Secondary | ICD-10-CM | POA: Diagnosis not present

## 2019-10-21 DIAGNOSIS — Z96659 Presence of unspecified artificial knee joint: Secondary | ICD-10-CM

## 2019-10-21 DIAGNOSIS — Z96651 Presence of right artificial knee joint: Secondary | ICD-10-CM

## 2019-10-21 HISTORY — PX: KNEE ARTHROPLASTY: SHX992

## 2019-10-21 SURGERY — ARTHROPLASTY, KNEE, TOTAL, USING IMAGELESS COMPUTER-ASSISTED NAVIGATION
Anesthesia: Spinal | Site: Knee | Laterality: Right

## 2019-10-21 MED ORDER — FENTANYL CITRATE (PF) 100 MCG/2ML IJ SOLN
INTRAMUSCULAR | Status: AC
  Filled 2019-10-21: qty 2

## 2019-10-21 MED ORDER — DEXAMETHASONE SODIUM PHOSPHATE 10 MG/ML IJ SOLN: 8.0000 mg | Freq: Once | INTRAMUSCULAR | Status: AC

## 2019-10-21 MED ORDER — ACETAMINOPHEN 10 MG/ML IV SOLN
INTRAVENOUS | Status: DC | PRN
  Administered 2019-10-21: 1000 mg via INTRAVENOUS

## 2019-10-21 MED ORDER — LACTATED RINGERS IV SOLN: INTRAVENOUS | Status: DC

## 2019-10-21 MED ORDER — SODIUM CHLORIDE FLUSH 0.9 % IV SOLN
INTRAVENOUS | Status: AC
  Filled 2019-10-21: qty 40

## 2019-10-21 MED ORDER — SODIUM CHLORIDE 0.9 % IV SOLN
INTRAVENOUS | Status: DC | PRN
  Administered 2019-10-21: 25 ug/min via INTRAVENOUS

## 2019-10-21 MED ORDER — PROPOFOL 500 MG/50ML IV EMUL
INTRAVENOUS | Status: AC
  Filled 2019-10-21: qty 50

## 2019-10-21 MED ORDER — FAMOTIDINE 20 MG PO TABS
ORAL_TABLET | ORAL | Status: AC
  Administered 2019-10-21: 08:00:00 20 mg via ORAL
  Filled 2019-10-21: qty 1

## 2019-10-21 MED ORDER — OXYCODONE HCL 5 MG/5ML PO SOLN: 5.0000 mg | Freq: Once | ORAL | Status: DC | PRN

## 2019-10-21 MED ORDER — NEOMYCIN-POLYMYXIN B GU 40-200000 IR SOLN
Status: DC | PRN
  Administered 2019-10-21: 14 mL

## 2019-10-21 MED ORDER — FENTANYL CITRATE (PF) 100 MCG/2ML IJ SOLN
25.0000 ug | INTRAMUSCULAR | Status: DC | PRN
  Administered 2019-10-21: 14:00:00 50 ug via INTRAVENOUS

## 2019-10-21 MED ORDER — TRANEXAMIC ACID-NACL 1000-0.7 MG/100ML-% IV SOLN: 1000.0000 mg | Freq: Once | INTRAVENOUS | Status: AC

## 2019-10-21 MED ORDER — LACTATED RINGERS IV BOLUS
500.0000 mL | Freq: Once | INTRAVENOUS | Status: AC
  Administered 2019-10-21: 500 mL via INTRAVENOUS

## 2019-10-21 MED ORDER — CELECOXIB 200 MG PO CAPS: 200.0000 mg | ORAL_CAPSULE | Freq: Two times a day (BID) | ORAL | 2 refills | Status: AC

## 2019-10-21 MED ORDER — MIDAZOLAM HCL 5 MG/5ML IJ SOLN
INTRAMUSCULAR | Status: DC | PRN
  Administered 2019-10-21: 2 mg via INTRAVENOUS

## 2019-10-21 MED ORDER — TRANEXAMIC ACID-NACL 1000-0.7 MG/100ML-% IV SOLN
INTRAVENOUS | Status: AC
  Administered 2019-10-21: 14:00:00 1000 mg via INTRAVENOUS
  Filled 2019-10-21: qty 100

## 2019-10-21 MED ORDER — BUPIVACAINE HCL (PF) 0.25 % IJ SOLN
INTRAMUSCULAR | Status: DC | PRN
  Administered 2019-10-21: 60 mL

## 2019-10-21 MED ORDER — CEFAZOLIN SODIUM-DEXTROSE 2-4 GM/100ML-% IV SOLN: 2.0000 g | Freq: Four times a day (QID) | INTRAVENOUS | Status: DC

## 2019-10-21 MED ORDER — GABAPENTIN 300 MG PO CAPS
ORAL_CAPSULE | ORAL | Status: AC
  Administered 2019-10-21: 300 mg via ORAL
  Filled 2019-10-21: qty 1

## 2019-10-21 MED ORDER — GABAPENTIN 300 MG PO CAPS: 300.0000 mg | ORAL_CAPSULE | Freq: Once | ORAL | Status: AC

## 2019-10-21 MED ORDER — SODIUM CHLORIDE 0.9 % IV SOLN
INTRAVENOUS | Status: DC | PRN
  Administered 2019-10-21: 40 mL

## 2019-10-21 MED ORDER — OXYCODONE HCL 5 MG PO TABS: 5.0000 mg | ORAL_TABLET | ORAL | 0 refills | Status: DC | PRN

## 2019-10-21 MED ORDER — CEFAZOLIN SODIUM-DEXTROSE 2-4 GM/100ML-% IV SOLN
INTRAVENOUS | Status: AC
  Administered 2019-10-21: 2 g via INTRAVENOUS
  Filled 2019-10-21: qty 100

## 2019-10-21 MED ORDER — LACTATED RINGERS IV BOLUS: 250.0000 mL | Freq: Once | INTRAVENOUS | Status: DC

## 2019-10-21 MED ORDER — FENTANYL CITRATE (PF) 100 MCG/2ML IJ SOLN
INTRAMUSCULAR | Status: DC | PRN
  Administered 2019-10-21 (×4): 25 ug via INTRAVENOUS

## 2019-10-21 MED ORDER — ENOXAPARIN SODIUM 40 MG/0.4ML ~~LOC~~ SOLN: 40.0000 mg | SUBCUTANEOUS | 0 refills | Status: DC

## 2019-10-21 MED ORDER — ONDANSETRON HCL 4 MG/2ML IJ SOLN
INTRAMUSCULAR | Status: DC | PRN
  Administered 2019-10-21: 4 mg via INTRAVENOUS

## 2019-10-21 MED ORDER — OXYCODONE HCL 5 MG PO TABS: 5.0000 mg | ORAL_TABLET | Freq: Once | ORAL | Status: DC | PRN

## 2019-10-21 MED ORDER — LACTATED RINGERS IV BOLUS
250.0000 mL | Freq: Once | INTRAVENOUS | Status: AC
  Administered 2019-10-21: 15:00:00 250 mL via INTRAVENOUS

## 2019-10-21 MED ORDER — CEFAZOLIN SODIUM-DEXTROSE 2-4 GM/100ML-% IV SOLN
INTRAVENOUS | Status: AC
  Filled 2019-10-21: qty 100

## 2019-10-21 MED ORDER — FAMOTIDINE 20 MG PO TABS: 20.0000 mg | ORAL_TABLET | Freq: Once | ORAL | Status: AC

## 2019-10-21 MED ORDER — TRANEXAMIC ACID-NACL 1000-0.7 MG/100ML-% IV SOLN
INTRAVENOUS | Status: AC
  Filled 2019-10-21: qty 100

## 2019-10-21 MED ORDER — CHLORHEXIDINE GLUCONATE 4 % EX LIQD: 60.0000 mL | Freq: Once | CUTANEOUS | Status: DC

## 2019-10-21 MED ORDER — TRAMADOL HCL 50 MG PO TABS: 50.0000 mg | ORAL_TABLET | Freq: Four times a day (QID) | ORAL | 0 refills | Status: DC | PRN

## 2019-10-21 MED ORDER — PROPOFOL 500 MG/50ML IV EMUL
INTRAVENOUS | Status: DC | PRN
  Administered 2019-10-21: 125 ug/kg/min via INTRAVENOUS

## 2019-10-21 MED ORDER — DEXAMETHASONE SODIUM PHOSPHATE 10 MG/ML IJ SOLN
INTRAMUSCULAR | Status: AC
  Administered 2019-10-21: 08:00:00 8 mg via INTRAVENOUS
  Filled 2019-10-21: qty 1

## 2019-10-21 MED ORDER — BUPIVACAINE HCL (PF) 0.5 % IJ SOLN
INTRAMUSCULAR | Status: DC | PRN
  Administered 2019-10-21: 3 mL

## 2019-10-21 MED ORDER — CELECOXIB 200 MG PO CAPS: 400.0000 mg | ORAL_CAPSULE | Freq: Once | ORAL | Status: AC

## 2019-10-21 MED ORDER — MIDAZOLAM HCL 2 MG/2ML IJ SOLN
INTRAMUSCULAR | Status: AC
  Filled 2019-10-21: qty 2

## 2019-10-21 MED ORDER — CEFAZOLIN SODIUM-DEXTROSE 2-4 GM/100ML-% IV SOLN
2.0000 g | INTRAVENOUS | Status: AC
  Administered 2019-10-21: 2 g via INTRAVENOUS

## 2019-10-21 MED ORDER — CELECOXIB 200 MG PO CAPS
ORAL_CAPSULE | ORAL | Status: AC
  Administered 2019-10-21: 08:00:00 400 mg via ORAL
  Filled 2019-10-21: qty 2

## 2019-10-21 MED ORDER — ENSURE PRE-SURGERY PO LIQD
296.0000 mL | Freq: Once | ORAL | Status: DC
  Filled 2019-10-21: qty 296

## 2019-10-21 MED ORDER — PROPOFOL 10 MG/ML IV BOLUS
INTRAVENOUS | Status: DC | PRN
  Administered 2019-10-21: 40 mg via INTRAVENOUS

## 2019-10-21 SURGICAL SUPPLY — 79 items
BATTERY INSTRU NAVIGATION (MISCELLANEOUS) ×8 IMPLANT
BLADE SAW 70X12.5 (BLADE) ×2 IMPLANT
BLADE SAW 90X13X1.19 OSCILLAT (BLADE) ×2 IMPLANT
BLADE SAW 90X25X1.19 OSCILLAT (BLADE) ×2 IMPLANT
BONE CEMENT GENTAMICIN (Cement) ×4 IMPLANT
CANISTER SUCT 3000ML PPV (MISCELLANEOUS) ×2 IMPLANT
CATH COUDE FOLEY 2W 5CC 16FR (CATHETERS) ×2 IMPLANT
CATH FOLEY SIL 2WAY 12FR5CC (CATHETERS) ×1 IMPLANT
CEMENT BONE GENTAMICIN 40 (Cement) IMPLANT
COOLER POLAR GLACIER W/PUMP (MISCELLANEOUS) ×2 IMPLANT
COVER WAND RF STERILE (DRAPES) ×2 IMPLANT
CUFF TOURN SGL QUICK 24 (TOURNIQUET CUFF) ×1
CUFF TRNQT CYL 24X4X16.5-23 (TOURNIQUET CUFF) IMPLANT
DRAPE 3/4 80X56 (DRAPES) ×2 IMPLANT
DRSG AQUACEL AG ADV 3.5X14 (GAUZE/BANDAGES/DRESSINGS) ×1 IMPLANT
DRSG DERMACEA 8X12 NADH (GAUZE/BANDAGES/DRESSINGS) ×2 IMPLANT
DRSG OPSITE POSTOP 4X14 (GAUZE/BANDAGES/DRESSINGS) ×2 IMPLANT
DRSG TEGADERM 4X4.75 (GAUZE/BANDAGES/DRESSINGS) ×2 IMPLANT
DURAPREP 26ML APPLICATOR (WOUND CARE) ×4 IMPLANT
ELECT REM PT RETURN 9FT ADLT (ELECTROSURGICAL) ×2
ELECTRODE REM PT RTRN 9FT ADLT (ELECTROSURGICAL) ×1 IMPLANT
EX-PIN ORTHOLOCK NAV 4X150 (PIN) ×4 IMPLANT
FEMUR SIGMA PS SZ 4.0 R (Femur) ×1 IMPLANT
GAUZE SPONGE 4X4 12PLY STRL (GAUZE/BANDAGES/DRESSINGS) ×1 IMPLANT
GLOVE BIO SURGEON STRL SZ7.5 (GLOVE) ×4 IMPLANT
GLOVE BIOGEL M STRL SZ7.5 (GLOVE) ×4 IMPLANT
GLOVE BIOGEL PI IND STRL 7.5 (GLOVE) ×1 IMPLANT
GLOVE BIOGEL PI INDICATOR 7.5 (GLOVE) ×1
GLOVE INDICATOR 8.0 STRL GRN (GLOVE) ×2 IMPLANT
GOWN STRL REUS W/ TWL LRG LVL3 (GOWN DISPOSABLE) ×2 IMPLANT
GOWN STRL REUS W/ TWL XL LVL3 (GOWN DISPOSABLE) ×1 IMPLANT
GOWN STRL REUS W/TWL LRG LVL3 (GOWN DISPOSABLE) ×2
GOWN STRL REUS W/TWL XL LVL3 (GOWN DISPOSABLE) ×1
HEMOVAC 400CC 10FR (MISCELLANEOUS) ×2 IMPLANT
HOLDER FOLEY CATH W/STRAP (MISCELLANEOUS) ×2 IMPLANT
HOOD PEEL AWAY FLYTE STAYCOOL (MISCELLANEOUS) ×4 IMPLANT
KIT TURNOVER KIT A (KITS) ×2 IMPLANT
KNIFE SCULPS 14X20 (INSTRUMENTS) ×2 IMPLANT
LABEL OR SOLS (LABEL) ×2 IMPLANT
MANIFOLD NEPTUNE II (INSTRUMENTS) ×2 IMPLANT
NDL SAFETY ECLIPSE 18X1.5 (NEEDLE) ×1 IMPLANT
NDL SPNL 20GX3.5 QUINCKE YW (NEEDLE) ×2 IMPLANT
NEEDLE HYPO 18GX1.5 SHARP (NEEDLE) ×1
NEEDLE SPNL 20GX3.5 QUINCKE YW (NEEDLE) ×4 IMPLANT
NS IRRIG 500ML POUR BTL (IV SOLUTION) ×2 IMPLANT
PACK TOTAL KNEE (MISCELLANEOUS) ×2 IMPLANT
PAD ABD DERMACEA PRESS 5X9 (GAUZE/BANDAGES/DRESSINGS) ×1 IMPLANT
PAD CAST CTTN 4X4 STRL (SOFTGOODS) IMPLANT
PAD WRAPON POLAR KNEE (MISCELLANEOUS) ×1 IMPLANT
PADDING CAST COTTON 4X4 STRL (SOFTGOODS) ×1
PATELLA DOME PFC 38MM (Knees) ×1 IMPLANT
PENCIL SMOKE EVACUATOR COATED (MISCELLANEOUS) ×2 IMPLANT
PENCIL SMOKE ULTRAEVAC 22 CON (MISCELLANEOUS) ×2 IMPLANT
PIN DRILL QUICK PACK ×2 IMPLANT
PIN FIXATION 1/8DIA X 3INL (PIN) ×6 IMPLANT
PLATE ROT INSERT 10MM SIZE 4 (Plate) ×1 IMPLANT
PULSAVAC PLUS IRRIG FAN TIP (DISPOSABLE) ×2
SOL .9 NS 3000ML IRR  AL (IV SOLUTION) ×1
SOL .9 NS 3000ML IRR UROMATIC (IV SOLUTION) ×1 IMPLANT
SOL PREP PVP 2OZ (MISCELLANEOUS) ×2
SOLUTION PREP PVP 2OZ (MISCELLANEOUS) ×1 IMPLANT
SPONGE DRAIN TRACH 4X4 STRL 2S (GAUZE/BANDAGES/DRESSINGS) ×2 IMPLANT
STAPLER SKIN PROX 35W (STAPLE) ×2 IMPLANT
STOCKINETTE BIAS CUT 6 980064 (GAUZE/BANDAGES/DRESSINGS) ×1 IMPLANT
STOCKINETTE IMPERV 14X48 (MISCELLANEOUS) IMPLANT
STRAP TIBIA SHORT (MISCELLANEOUS) ×2 IMPLANT
SUCTION FRAZIER HANDLE 10FR (MISCELLANEOUS) ×1
SUCTION TUBE FRAZIER 10FR DISP (MISCELLANEOUS) ×1 IMPLANT
SUT VIC AB 0 CT1 36 (SUTURE) ×4 IMPLANT
SUT VIC AB 1 CT1 36 (SUTURE) ×4 IMPLANT
SUT VIC AB 2-0 CT2 27 (SUTURE) ×2 IMPLANT
SYR 20ML LL LF (SYRINGE) ×2 IMPLANT
SYR 30ML LL (SYRINGE) ×4 IMPLANT
TIP FAN IRRIG PULSAVAC PLUS (DISPOSABLE) ×1 IMPLANT
TOWEL OR 17X26 4PK STRL BLUE (TOWEL DISPOSABLE) ×2 IMPLANT
TOWER CARTRIDGE SMART MIX (DISPOSABLE) ×2 IMPLANT
TRAY FOLEY MTR SLVR 16FR STAT (SET/KITS/TRAYS/PACK) ×2 IMPLANT
TRAY REVISION SZ 4 (Knees) ×1 IMPLANT
WRAPON POLAR PAD KNEE (MISCELLANEOUS) ×2

## 2019-10-21 NOTE — Anesthesia Procedure Notes (Signed)
Date/Time: 10/21/2019 8:20 AM Performed by: Nelda Marseille, CRNA Pre-anesthesia Checklist: Patient identified, Emergency Drugs available, Suction available, Patient being monitored and Timeout performed Oxygen Delivery Method: Simple face mask

## 2019-10-21 NOTE — Op Note (Signed)
OPERATIVE NOTE  DATE OF SURGERY:  10/21/2019  PATIENT NAME:  Shawn Phillips   DOB: June 07, 1956  MRN: [id number]  PRE-OPERATIVE DIAGNOSIS: Degenerative arthrosis of the right knee, primary  POST-OPERATIVE DIAGNOSIS:  Same  PROCEDURE:  Right total knee arthroplasty using computer-assisted navigation  SURGEON:  Marciano Sequin. M.D.  ASSISTANT: Cassell Smiles, PA-C (present and scrubbed throughout the case, critical for assistance with exposure, retraction, instrumentation, and closure)  ANESTHESIA: spinal  ESTIMATED BLOOD LOSS: 75 mL  FLUIDS REPLACED: 1700 mL of crystalloid  TOURNIQUET TIME: 136 minutes  DRAINS: 2 medium Hemovac drains  SOFT TISSUE RELEASES: Anterior cruciate ligament, posterior cruciate ligament, deep and superficial medial collateral ligament, patellofemoral ligament  IMPLANTS UTILIZED: DePuy PFC Sigma size 4 posterior stabilized femoral component (cemented), size 4 MBT revision tibial component (cemented), 38 mm 3 peg oval dome patella (cemented), and a 10 mm stabilized rotating platform polyethylene insert.  INDICATIONS FOR SURGERY: Shawn Phillips is a 64 y.o. year old male with a long history of progressive knee pain. X-rays demonstrated severe degenerative changes in tricompartmental fashion. The patient had not seen any significant improvement despite conservative nonsurgical intervention. After discussion of the risks and benefits of surgical intervention, the patient expressed understanding of the risks benefits and agree with plans for total knee arthroplasty.   The risks, benefits, and alternatives were discussed at length including but not limited to the risks of infection, bleeding, nerve injury, stiffness, blood clots, the need for revision surgery, cardiopulmonary complications, among others, and they were willing to proceed.  PROCEDURE IN DETAIL: The patient was brought into the operating room and, after adequate spinal anesthesia was achieved, a  tourniquet was placed on the patient's upper thigh. The patient's knee and leg were cleaned and prepped with alcohol and DuraPrep and draped in the usual sterile fashion. A "timeout" was performed as per usual protocol. The lower extremity was exsanguinated using an Esmarch, and the tourniquet was inflated to 300 mmHg. An anterior longitudinal incision was made followed by a standard mid vastus approach. The deep fibers of the medial collateral ligament were elevated in a subperiosteal fashion off of the medial flare of the tibia so as to maintain a continuous soft tissue sleeve. The patella was subluxed laterally and the patellofemoral ligament was incised. Inspection of the knee demonstrated severe degenerative changes with full-thickness loss of articular cartilage. Osteophytes were debrided using a rongeur. Anterior and posterior cruciate ligaments were excised. Two 4.0 mm Schanz pins were inserted in the femur and into the tibia for attachment of the array of trackers used for computer-assisted navigation. Hip center was identified using a circumduction technique. Distal landmarks were mapped using the computer. The distal femur and proximal tibia were mapped using the computer. The distal femoral cutting guide was positioned using computer-assisted navigation so as to achieve a 5 distal valgus cut. The femur was sized and it was felt that a size 4 femoral component was appropriate. A size 4 femoral cutting guide was positioned and the anterior cut was performed and verified using the computer. This was followed by completion of the posterior and chamfer cuts. Femoral cutting guide for the central box was then positioned in the center box cut was performed.  Attention was then directed to the proximal tibia. Medial and lateral menisci were excised.  There was erosion to the posteromedial aspect of the plateau.  The extramedullary tibial cutting guide was positioned using computer-assisted navigation so as to  achieve a 0 varus-valgus alignment  and 0 posterior slope. The cut was performed and verified using the computer.  There was noted to be a cystic lesion along the posteromedial aspect of the plateau and this was curettaged and debrided.  The proximal tibia was sized and it was felt that a size 4 tibial tray was appropriate. Tibial and femoral trials were inserted followed by insertion of a 10 mm polyethylene insert. The knee was felt to be tight medially. A Cobb elevator was used to elevate the superficial fibers of the medial collateral ligament.  This allowed for excellent mediolateral soft tissue balancing both in flexion and in full extension. Finally, the patella was cut and prepared so as to accommodate a 38 mm 3 peg oval dome patella. A patella trial was placed and the knee was placed through a range of motion with excellent patellar tracking appreciated. The femoral trial was removed after debridement of posterior osteophytes. The central post-hole for the tibial component was reamed so as to accommodate an MBT revision tray followed by insertion of a keel punch. Tibial trials were then removed. Cut surfaces of bone were irrigated with copious amounts of normal saline with antibiotic solution using pulsatile lavage and then suctioned dry. Polymethylmethacrylate cement with gentamicin was prepared in the usual fashion using a vacuum mixer. Cement was applied to the cut surface of the proximal tibia as well as along the undersurface of a size 4 MBT revision tibial component. Tibial component was positioned and impacted into place. Excess cement was removed using Civil Service fast streamer. Cement was then applied to the cut surfaces of the femur as well as along the posterior flanges of the size 4 femoral component. The femoral component was positioned and impacted into place. Excess cement was removed using Civil Service fast streamer. A 10 mm polyethylene trial was inserted and the knee was brought into full extension with steady  axial compression applied. Finally, cement was applied to the backside of a 38 mm 3 peg oval dome patella and the patellar component was positioned and patellar clamp applied. Excess cement was removed using Civil Service fast streamer. After adequate curing of the cement, the tourniquet was deflated after a total tourniquet time of 136 minutes. Hemostasis was achieved using electrocautery. The knee was irrigated with copious amounts of normal saline with antibiotic solution using pulsatile lavage and then suctioned dry. 20 mL of 1.3% Exparel and 60 mL of 0.25% Marcaine in 40 mL of normal saline was injected along the posterior capsule, medial and lateral gutters, and along the arthrotomy site. A 10 mm stabilized rotating platform polyethylene insert was inserted and the knee was placed through a range of motion with excellent mediolateral soft tissue balancing appreciated and excellent patellar tracking noted. 2 medium drains were placed in the wound bed and brought out through separate stab incisions. The medial parapatellar portion of the incision was reapproximated using interrupted sutures of #1 Vicryl. Subcutaneous tissue was approximated in layers using first #0 Vicryl followed #2-0 Vicryl. The skin was approximated with skin staples. A sterile dressing was applied.  The patient tolerated the procedure well and was transported to the recovery room in stable condition.    Shloimy P. Holley Bouche., M.D.

## 2019-10-21 NOTE — H&P (Signed)
The patient has been re-examined, and the chart reviewed, and there have been no interval changes to the documented history and physical.    The risks, benefits, and alternatives have been discussed at length. The patient expressed understanding of the risks benefits and agreed with plans for surgical intervention.  Omarr P. Burnell Matlin, Jr. M.D.    

## 2019-10-21 NOTE — Discharge Instructions (Signed)
AMBULATORY SURGERY  °DISCHARGE INSTRUCTIONS ° ° °1) The drugs that you were given will stay in your system until tomorrow so for the next 24 hours you should not: ° °A) Drive an automobile °B) Make any legal decisions °C) Drink any alcoholic beverage ° ° °2) You may resume regular meals tomorrow.  Today it is better to start with liquids and gradually work up to solid foods. ° °You may eat anything you prefer, but it is better to start with liquids, then soup and crackers, and gradually work up to solid foods. ° ° °3) Please notify your doctor immediately if you have any unusual bleeding, trouble breathing, redness and pain at the surgery site, drainage, fever, or pain not relieved by medication. ° ° ° °4) Additional Instructions: ° ° ° ° ° ° ° °Please contact your physician with any problems or Same Day Surgery at [phone], Monday through Friday 6 am to 4 pm, or [facility] Main number at [phone].  °Instructions after Total Knee Replacement ° ° Crist P. Hooten, Jr., M.D.    ° Dept. of Orthopaedics & Sports Medicine ° [facility] ° [address] ° [place], [place]  [address] ° Phone: [phone]   Fax: [phone] ° °  °DIET: °• Drink plenty of non-alcoholic fluids. °• Resume your normal diet. Include foods high in fiber. ° °ACTIVITY:  °• You may use crutches or a walker with weight-bearing as tolerated, unless instructed otherwise. °• You may be weaned off of the walker or crutches by your Physical Therapist.  °• Do NOT place pillows under the knee. Anything placed under the knee could limit your ability to straighten the knee.   °• Continue doing gentle exercises. Exercising will reduce the pain and swelling, increase motion, and prevent muscle weakness.   °• Please continue to use the TED compression stockings for 6 weeks. You may remove the stockings at night, but should reapply them in the morning. °• Do not drive or operate any equipment until instructed. ° °WOUND CARE:   °• Continue to use the PolarCare or ice packs periodically to reduce pain and swelling. °• You may bathe or shower after the staples are removed at the first office visit following surgery. ° °MEDICATIONS: °• You may resume your regular medications. °• Please take the pain medication as prescribed on the medication. °• Do not take pain medication on an empty stomach. °• You have been given a prescription for a blood thinner (Lovenox or Coumadin). Please take the medication as instructed. (NOTE: After completing a 2 week course of Lovenox, take one Enteric-coated aspirin once a day. This along with elevation will help reduce the possibility of phlebitis in your operated leg.) °• Do not drive or drink alcoholic beverages when taking pain medications. ° °CALL THE OFFICE FOR: °• Temperature above 101 degrees °• Excessive bleeding or drainage on the dressing. °• Excessive swelling, coldness, or paleness of the toes. °• Persistent nausea and vomiting. ° °FOLLOW-UP:  °• You should have an appointment to return to the office in 10-14 days after surgery. °• Arrangements have been made for continuation of Physical Therapy (either home therapy or outpatient therapy). ° ° ° °[facility] °Department Directory °     ° ° ° [email or web address] ° ° °  ° ° °[email or web address] °  ° °      °Cardiology ° °Appointments: °[place] - [phone] °[place] - [phone]  Endocrinology ° °Appointments: °[place] - [phone] °[place] - [phone]  Gastroenterology ° °Appointments: °[place] -   [phone] °[place] - [phone]  °      °General Surgery ° ° °Appointments: °[place] - [phone]  Internal Medicine/Family Medicine ° °Appointments: °[place] - [phone] °[place] - [phone] °[place] - [phone]  Metabolic and Weigh Loss Surgery ° °Appointments: °[place] - [phone]  °      °Neurology ° °Appointments: °[place] - [phone] °[place] - [phone]   Neurosurgery ° °Appointments: °[place] - [phone]  Obstetrics & Gynecology ° °Appointments: °[place] - [phone] °[place] - [phone]  °      °Pediatrics ° °Appointments: °[place] - [phone] °[place] - [phone]  Physiatry ° °Appointments: °[place] -[phone]  Physical Therapy ° °Appointments: °[place] - [phone] °[place] - [phone]  °      °Podiatry ° °Appointments: °[place] - [phone] °[place] - [phone]  Pulmonology ° °Appointments: °[place] - [phone]  Rheumatology ° °Appointments: °[place] - [phone]  °      °[place] Location: °[facility]  °[address] °, [place]  [address]  [place] Location: °[facility] °[address] °[place], [place]  [address]  [place] Location: °[facility] °[address] °[place], [place]  [address]  °  °

## 2019-10-21 NOTE — Anesthesia Procedure Notes (Signed)
Spinal  Patient location during procedure: OR Start time: 10/21/2019 8:16 AM End time: 10/21/2019 8:19 AM Staffing Performed: resident/CRNA  Resident/CRNA: Nelda Marseille, CRNA Preanesthetic Checklist Completed: patient identified, IV checked, site marked, risks and benefits discussed, surgical consent, monitors and equipment checked, pre-op evaluation and timeout performed Spinal Block Patient position: sitting Prep: Betadine Patient monitoring: heart rate, continuous pulse ox, blood pressure and cardiac monitor Approach: midline Location: L3-4 Injection technique: single-shot Needle Needle type: Whitacre and Introducer  Needle gauge: 25 G Needle length: 9 cm Assessment Sensory level: T10 Additional Notes Negative paresthesia. Negative blood return. Positive free-flowing CSF. Expiration date of kit checked and confirmed. Patient tolerated procedure well, without complications.

## 2019-10-21 NOTE — Anesthesia Preprocedure Evaluation (Signed)
Anesthesia Evaluation  Patient identified by MRN, date of birth, ID band Patient awake    Reviewed: Allergy & Precautions, H&P , NPO status , Patient's Chart, lab work & pertinent test results  History of Anesthesia Complications (+) MALIGNANT HYPERTHERMIA and history of anesthetic complications  Airway Mallampati: III  TM Distance: >3 FB Neck ROM: full    Dental  (+) Chipped   Pulmonary neg pulmonary ROS, neg shortness of breath,           Cardiovascular Exercise Tolerance: Good (-) angina(-) Past MI negative cardio ROS       Neuro/Psych negative neurological ROS  negative psych ROS   GI/Hepatic negative GI ROS, Neg liver ROS, neg GERD  ,  Endo/Other  Hypothyroidism   Renal/GU      Musculoskeletal  (+) Arthritis ,   Abdominal   Peds  Hematology negative hematology ROS (+)   Anesthesia Other Findings Past Medical History: No date: Allergic rhinitis No date: Arthritis     Comment:  right knee No date: Complication of anesthesia No date: Diverticulosis of colon No date: Hypothyroidism No date: Malignant hyperthermia  Past Surgical History: No date: ANTERIOR CRUCIATE LIGAMENT REPAIR     Comment:  Left 1980: BACK SURGERY     Comment:  lumbar disk 1984: KNEE ARTHROSCOPY     Comment:  Right 2001: LASIK 2010: NASAL SINUS SURGERY     Comment:  left side, chronic septal perforation  BMI    Body Mass Index: 25.84 kg/m      Reproductive/Obstetrics negative OB ROS                             Anesthesia Physical Anesthesia Plan  ASA: III  Anesthesia Plan: Spinal   Post-op Pain Management:    Induction:   PONV Risk Score and Plan:   Airway Management Planned: Natural Airway and Nasal Cannula  Additional Equipment:   Intra-op Plan:   Post-operative Plan:   Informed Consent: I have reviewed the patients History and Physical, chart, labs and discussed the procedure  including the risks, benefits and alternatives for the proposed anesthesia with the patient or authorized representative who has indicated his/her understanding and acceptance.     Dental Advisory Given  Plan Discussed with: Anesthesiologist, CRNA and Surgeon  Anesthesia Plan Comments: (Patient with MH history.  Plan for non trigger anesthetic   Patient reports no bleeding problems and no anticoagulant use.  Plan for spinal with backup GA  Patient consented for risks of anesthesia including but not limited to:  - adverse reactions to medications - risk of bleeding, infection, nerve damage and headache - risk of failed spinal - damage to teeth, lips or other oral mucosa - sore throat or hoarseness - Damage to heart, brain, lungs or loss of life  Patient voiced understanding.)        Anesthesia Quick Evaluation

## 2019-10-21 NOTE — Op Note (Signed)
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  On my arrival, nursing staff had tried several catheters including a normal 16 [place], coud tip catheter and a 84 [place] which did not appear to be appropriate position.  This catheter was removed.  The patient was then prepped and draped in standard sterile fashion using Betadine.  I was able to successfully advance a 16 [place] coud catheter into the bladder but it was relatively tight/narrow presumably in the bulbar urethra presumably secondary to a stricture at this location.  He was able to have the catheter which time the balloon was filled.  Catheter was pulled back to the bladder neck.  It was irrigated using sterile water with return of light yellow urine.  Plan: Foley catheter may be removed per primary team when deemed appropriate.  Hollice Espy, MD

## 2019-10-21 NOTE — Evaluation (Addendum)
Physical Therapy Evaluation Patient Details Name: Shawn Phillips MRN: [id number] DOB: 08/05/1956 Today's Date: 10/21/2019   History of Present Illness  Pt is a 64 y.o. male who presents with degenerative arthrosis of the right knee. He is now s/p elective R TKA.  PMH includes hypothyroidism and back Sx.    Clinical Impression  Pt pleasant and motivated to participate throughout the session and performed very well especially considering POD#0 status.  Pt was able to perform all bed mobility tasks with only min extra time and effort but with no physical assistance required.  Pt was steady with good eccentric and concentric control with transfers and while ascending and descending stairs.  Pt somewhat antalgic with gait but was able to amb 60 feet without instability or buckling.  Pt's BP 121/82 with HR 56 bpm with typical HR "around 60 bpm" per patient.  No adverse symptoms other than R knee pain reported during the session.  Pt will benefit from HHPT services upon discharge to safely address deficits listed in patient problem list for decreased caregiver assistance and eventual return to PLOF.     Follow Up Recommendations Home health PT;Supervision for mobility/OOB    Equipment Recommendations  None recommended by PT    Recommendations for Other Services       Precautions / Restrictions Precautions Precautions: Fall Precaution Comments: High Fall Restrictions Weight Bearing Restrictions: Yes RLE Weight Bearing: Weight bearing as tolerated Other Position/Activity Restrictions: Pt WBAT per verbal order from Dr. Marry Guan      Mobility  Bed Mobility Overal bed mobility: Modified Independent             General bed mobility comments: Min extra time and effort required  Transfers Overall transfer level: Needs assistance Equipment used: Rolling walker (2 wheeled) Transfers: Sit to/from Stand Sit to Stand: Min guard         General transfer comment: Mod verbal cues for  sequencing with good eccentric and concentric control  Ambulation/Gait Ambulation/Gait assistance: Min guard Gait Distance (Feet): 50 Feet Assistive device: Rolling walker (2 wheeled) Gait Pattern/deviations: Step-to pattern;Decreased step length - left;Decreased stance time - right;Antalgic Gait velocity: decreased   General Gait Details: Mod lean on the RW for R knee pain control, improved with verbal cues for decreased UE WB.  Stairs Stairs: Yes Stairs assistance: Min guard Stair Management: Two rails Number of Stairs: 4 General stair comments: Visual demonstration with teach back method followed by pt ascending and descending 4 steps wtih B rails with mod verbal cues for sequencing with dtr present for education on proper guarding technique  Wheelchair Mobility    Modified Rankin (Stroke Patients Only)       Balance Overall balance assessment: Needs assistance   Sitting balance-Leahy Scale: Normal     Standing balance support: Bilateral upper extremity supported Standing balance-Leahy Scale: Good                               Pertinent Vitals/Pain Pain Assessment: 0-10 Pain Score: 5  Pain Location: R Knee Pain Descriptors / Indicators: Aching;Sore Pain Intervention(s): Premedicated before session;Monitored during session    [place] expects to be discharged to:: Private residence Living Arrangements: Spouse/significant other;Children Available Help at Discharge: Family;Available 24 hours/day Type of Home: House Home Access: Stairs to enter Entrance Stairs-Rails: Can reach both;Left;Right Entrance Stairs-Number of Steps: 4-5 Home Layout: Multi-level;Able to live on main level with bedroom/bathroom Home Equipment: Gilford Rile -  4 wheels;Shower seat - built in;Bedside commode;Walker - 2 wheels      Prior Function Level of Independence: Independent         Comments: Pt endorses total independence with ADL/IADL, Ind amb without an  AD community distances, no fall history, exercises 4 days/wk     Hand Dominance   Dominant Hand: Right    Extremity/Trunk Assessment   Upper Extremity Assessment Upper Extremity Assessment: Overall WFL for tasks assessed    Lower Extremity Assessment Lower Extremity Assessment: Generalized weakness;RLE deficits/detail RLE Deficits / Details: Pt able to perform Ind RLE SLR without extensor lag, no KI required RLE Sensation: WNL RLE Coordination: WNL    Cervical / Trunk Assessment Cervical / Trunk Assessment: Normal  Communication   Communication: No difficulties  Cognition Arousal/Alertness: Awake/alert Behavior During Therapy: WFL for tasks assessed/performed Overall Cognitive Status: Within Functional Limits for tasks assessed                                 General Comments: Pt awake, alert. Participates well in OT evaluation.      General Comments      Exercises Total Joint Exercises Goniometric ROM: R knee AROM: 10-95 deg Other Exercises Other Exercises: HEP education and practice for R knee QS and seated knee flexion Other Exercises: Positioning education to promote R knee ext PROM and prevent heel pressure Other Exercises: Car transfer sequencing verbal education provided to pt and dtr Other Exercises: 90 deg R turn training to prevent CKC twisting on the R knee Other Exercises: Multiple sit to/from stand transfers from various height surfaces with training on proper sequencing with pt and dtr   Assessment/Plan    PT Assessment Patient needs continued PT services  PT Problem List Decreased strength;Decreased range of motion;Decreased activity tolerance;Decreased balance;Decreased mobility;Decreased knowledge of use of DME;Pain       PT Treatment Interventions DME instruction;Gait training;Stair training;Functional mobility training;Therapeutic activities;Therapeutic exercise;Balance training;Patient/family education    PT Goals (Current goals  can be found in the Care Plan section)  Acute Rehab PT Goals Patient Stated Goal: To get back to walking better PT Goal Formulation: With patient Time For Goal Achievement: 11/03/19 Potential to Achieve Goals: Good    Frequency BID   Barriers to discharge        Co-evaluation               AM-PAC PT "6 Clicks" Mobility  Outcome Measure Help needed turning from your back to your side while in a flat bed without using bedrails?: None Help needed moving from lying on your back to sitting on the side of a flat bed without using bedrails?: None Help needed moving to and from a bed to a chair (including a wheelchair)?: A Little Help needed standing up from a chair using your arms (e.g., wheelchair or bedside chair)?: A Little Help needed to walk in hospital room?: A Little Help needed climbing 3-5 steps with a railing? : A Little 6 Click Score: 20    End of Session Equipment Utilized During Treatment: Gait belt Activity Tolerance: Patient tolerated treatment well Patient left: in chair;with nursing/sitter in room;with family/visitor present Nurse Communication: Mobility status PT Visit Diagnosis: Other abnormalities of gait and mobility (R26.89);Muscle weakness (generalized) (M62.81);Pain Pain - Right/Left: Right Pain - part of body: Knee    Time: 1540-1631 PT Time Calculation (min) (ACUTE ONLY): 51 min   Charges:   PT Evaluation $  PT Eval Moderate Complexity: 1 Mod PT Treatments $Gait Training: 8-22 mins $Therapeutic Exercise: 8-22 mins $Therapeutic Activity: 8-22 mins       D. Royetta Asal PT, DPT 10/21/19, 4:55 PM

## 2019-10-21 NOTE — OR Nursing (Signed)
Multiple attempts were made to insert foley catheter. Resistance met on attempts. Dr Erlene Quan called to insert foley.

## 2019-10-21 NOTE — Evaluation (Signed)
Occupational Therapy Evaluation Patient Details Name: Shawn Phillips MRN: [id number] DOB: 07/28/1956 Today's Date: 10/21/2019    History of Present Illness Mr. Shawn Phillips is a 64 y.o. male who presents with degenerative arthrosis of the right knee. He is now s/p R TKA.   Clinical Impression   Shawn Phillips was seen for OT evaluation this date, POD#0 from above surgery. Pt received semi-supine in bed in the PACU with primary RN stating pt doing well s/p procedure and appropriate for OT evaluation. Pt dtr presents to bedside and present t/o OT education. Pt was independent in all ADLs prior to surgery, he denies use of an AD for functional mobility or falls history in the past year. Pt is eager to return to PLOF with less pain and improved safety and independence. Pt currently requires minimal assist for LB dressing while in seated position due to pain and limited AROM of R knee. Pt instructed in polar care mgt, falls prevention strategies, home/routines modifications, DME/AE for LB bathing and dressing tasks, and compression stocking mgt. Pt would benefit from skilled OT services including additional instruction in dressing techniques with or without assistive devices for dressing and bathing skills to support recall and carryover prior to discharge and ultimately to maximize safety, independence, and minimize falls risk and caregiver burden. Do not currently anticipate any OT needs following this hospitalization.       Follow Up Recommendations  No OT follow up    Equipment Recommendations  None recommended by OT(Pt has necessary equipment)    Recommendations for Other Services       Precautions / Restrictions Precautions Precautions: Fall Precaution Comments: High Fall Restrictions Weight Bearing Restrictions: Yes RLE Weight Bearing: Weight bearing as tolerated      Mobility Bed Mobility Overal bed mobility: Modified Independent             General bed mobility comments:  Comes to sitting EOB with mild increased time/effort to perform. HOB elevated.  Transfers                 General transfer comment: Deferred. Physical therapist in at end of session to assess gait/mobility.    Balance Overall balance assessment: No apparent balance deficits (not formally assessed)(Steady static and dynamic sitting at EOB to perform LB ADL Mgt. No LOB noted with weight shift during functional task.)                                         ADL either performed or assessed with clinical judgement   ADL Overall ADL's : Needs assistance/impaired                                       General ADL Comments: Pt functionally limited by pain and decreased ROM in the R knee this date. Requires min A for LB ADL management including bathing and dressing in a seated position. Mod-Max assist to don compression stockings. Pt educated on safe use of AE/DME for ADL management. Able to return demo understanding of education provided on adaptive equipment by doning hospital sock on LLE this date.     Vision Baseline Vision/History: Wears glasses(contact lenses) Wears Glasses: At all times Patient Visual Report: No change from baseline       Perception     Praxis  Pertinent Vitals/Pain Pain Assessment: 0-10 Pain Score: 5  Pain Location: R Knee Pain Descriptors / Indicators: Grimacing;Guarding;Sore Pain Intervention(s): Limited activity within patient's tolerance;Monitored during session;Premedicated before session;Repositioned     Hand Dominance Right   Extremity/Trunk Assessment Upper Extremity Assessment Upper Extremity Assessment: Overall WFL for tasks assessed   Lower Extremity Assessment Lower Extremity Assessment: Overall WFL for tasks assessed;RLE deficits/detail RLE Deficits / Details: S/p R TKA. RLE Coordination: decreased gross motor   Cervical / Trunk Assessment Cervical / Trunk Assessment: Normal   Communication  Communication Communication: No difficulties   Cognition Arousal/Alertness: Awake/alert Behavior During Therapy: WFL for tasks assessed/performed Overall Cognitive Status: Within Functional Limits for tasks assessed                                 General Comments: Pt awake, alert. Participates well in OT evaluation.   General Comments       Exercises Other Exercises Other Exercises: Pt & caregiver (dtr) educated in falls prevention strategies, safe use of AE/DME for ADL mgt, compression stocking mgt, polar care mgt, and routines modifications to support safety and functional independence upon hospital DC. Handout provided to support recall and carryover.   Shoulder Instructions      Home Living Family/patient expects to be discharged to:: Private residence Living Arrangements: Spouse/significant other Available Help at Discharge: Family;Available 24 hours/day Type of Home: House Home Access: Stairs to enter [employer] of Steps: 4-5 Entrance Stairs-Rails: Can reach both;Left;Right Home Layout: Multi-level;Able to live on main level with bedroom/bathroom     Bathroom Shower/Tub: Occupational psychologist: Standard     Home Equipment: Environmental consultant - 4 wheels;Shower seat - built in;Bedside commode          Prior Functioning/Environment Level of Independence: Independent        Comments: Pt endorses total independence with ADL/IADL PTA. No AD for functional mobility.        OT Problem List: Decreased strength;Pain;Decreased coordination;Decreased safety awareness;Decreased knowledge of use of DME or AE;Decreased knowledge of precautions      OT Treatment/Interventions: Self-care/ADL training;Therapeutic exercise;Therapeutic activities;DME and/or AE instruction;Patient/family education;Balance training    OT Goals(Current goals can be found in the care plan section) Acute Rehab OT Goals Patient Stated Goal: To go home and get back to daily  routines OT Goal Formulation: With patient Time For Goal Achievement: 11/04/19 Potential to Achieve Goals: Good ADL Goals Pt Will Perform Lower Body Dressing: sit to/from stand;with modified independence(With LRAD PRN for improved safety and functional independence) Pt Will Transfer to Toilet: bedside commode;ambulating;regular height toilet;with modified independence(With LRAD PRN for improved safety and functional independence) Pt Will Perform Toileting - Clothing Manipulation and hygiene: sit to/from stand;with modified independence;with adaptive equipment(With LRAD PRN for improved safety and functional independence)  OT Frequency: Min 1X/week   Barriers to D/C:            Co-evaluation              AM-PAC OT "6 Clicks" Daily Activity     Outcome Measure Help from another person eating meals?: None Help from another person taking care of personal grooming?: None Help from another person toileting, which includes using toliet, bedpan, or urinal?: A Little Help from another person bathing (including washing, rinsing, drying)?: A Little Help from another person to put on and taking off regular upper body clothing?: None Help from another person to put on and  taking off regular lower body clothing?: A Little 6 Click Score: 21   End of Session    Activity Tolerance: Patient tolerated treatment well Patient left: in bed;with call bell/phone within reach;with family/visitor present;Other (comment)(With Physical therapist in for PT eval.)  OT Visit Diagnosis: Other abnormalities of gait and mobility (R26.89);Pain Pain - Right/Left: Right Pain - part of body: Knee;Leg                Time: [id number] OT Time Calculation (min): 30 min Charges:  OT General Charges $OT Visit: 1 Visit OT Evaluation $OT Eval Moderate Complexity: 1 Mod OT Treatments $Self Care/Home Management : 8-22 mins  Shara Blazing, M.S., OTR/L Ascom: [phone] 10/21/19, 4:20 PM

## 2019-10-21 NOTE — Transfer of Care (Signed)
Immediate Anesthesia Transfer of Care Note  Patient: Shawn Phillips  Procedure(s) Performed: RIGHT COMPUTER ASSISTED TOTAL KNEE ARTHROPLASTY (Right Knee)  Patient Location: PACU  Anesthesia Type:Spinal  Level of Consciousness: sedated  Airway & Oxygen Therapy: Patient Spontanous Breathing and Patient connected to face mask oxygen  Post-op Assessment: Report given to RN and Post -op Vital signs reviewed and stable  Post vital signs: Reviewed and stable  Last Vitals:  Vitals Value Taken Time  BP    Temp    Pulse 75 10/21/19 1323  Resp 21 10/21/19 1323  SpO2 98 % 10/21/19 1323  Vitals shown include unvalidated device data.  Last Pain:  Vitals:   10/21/19 0718  TempSrc:   PainSc: 0-No pain         Complications: No apparent anesthesia complications

## 2019-10-22 NOTE — Anesthesia Postprocedure Evaluation (Signed)
Anesthesia Post Note  Patient: Shawn Phillips  Procedure(s) Performed: RIGHT COMPUTER ASSISTED TOTAL KNEE ARTHROPLASTY (Right Knee)  Patient location during evaluation: PACU Anesthesia Type: Spinal Level of consciousness: oriented and awake and alert Pain management: pain level controlled Vital Signs Assessment: post-procedure vital signs reviewed and stable Respiratory status: spontaneous breathing Cardiovascular status: blood pressure returned to baseline and stable Postop Assessment: no headache, no backache, no apparent nausea or vomiting and patient able to bend at knees Anesthetic complications: no     Last Vitals:  Vitals:   10/21/19 1507 10/21/19 1700  BP: 125/69 126/70  Pulse: 62 64  Resp: 18 17  Temp: 36.5 C 36.4 C  SpO2: 96%     Last Pain:  Vitals:   10/21/19 1700  TempSrc:   PainSc: 0-No pain                 Precious Haws Geanie Pacifico

## 2019-10-22 NOTE — Progress Notes (Signed)
[facility]  Physical Therapy Certification  Patient Details  Name: Shawn Phillips MRN: [id number] Date of Birth: [birth year] Medical Diagnosis: Active Problems:   * No active hospital problems. *  Visit Diagnosis: PT Visit Diagnosis: Other abnormalities of gait and mobility (R26.89), Muscle weakness (generalized) (M62.81), Pain Pain - Right/Left: Right Pain - part of body: Knee PT Problem: Decreased strength, Decreased range of motion, Decreased activity tolerance, Decreased balance, Decreased mobility, Decreased knowledge of use of DME, Pain  Goals: Pt will go Sit to Supine/Side: Independently(for return to PLOF.) Pt will Transfer Bed to Chair/Chair to Bed: with modified independence(for decreased caregiver assistance.) Pt will Ambulate: > 125 feet, with modified independence, with least restrictive assistive device(for decreased caregiver assistance.) Pt will Go Up / Down Stairs: 3-5 stairs, with modified independence, with rail(s)(for decreased caregiver assistance.)  Duration: Services will be provided through the following date: 11/03/19  Frequency: BID  Amount: one treatment session per day unless otherwise indicated.    Certification Start Date: [id number] Certification End Date: 11/02/19   PT Treatments/Interventions: DME instruction, Gait training, Stair training, Functional mobility training, Therapeutic activities, Therapeutic exercise, Balance training, Patient/family education   Ladue H. Owens Shark, PT, DPT, NCS 10/22/19, 9:05 AM [phone]

## 2020-01-08 ENCOUNTER — Ambulatory Visit: Payer: BC Managed Care – PPO | Attending: Oncology

## 2020-01-08 DIAGNOSIS — Z23 Encounter for immunization: Secondary | ICD-10-CM

## 2020-01-08 NOTE — Progress Notes (Signed)
   Covid-19 Vaccination Clinic  Name:  TANDY LUCKADOO    MRN: [id number] DOB: 10/27/1955  01/08/2020  Mr. Curless was observed post Covid-19 immunization for 30 minutes based on pre-vaccination screening without incident. He was provided with Vaccine Information Sheet and instruction to access the V-Safe system.   Mr. Catrett was instructed to call 911 with any severe reactions post vaccine: Marland Kitchen Difficulty breathing  . Swelling of face and throat  . A fast heartbeat  . A bad rash all over body  . Dizziness and weakness   Immunizations Administered    Name Date Dose VIS Date Route   Pfizer COVID-19 Vaccine 01/08/2020 10:30 AM 0.3 mL 09/11/2019 Intramuscular   Manufacturer: [place]   Lot: [id number]   [place]: [id number]

## 2020-02-03 ENCOUNTER — Ambulatory Visit: Payer: BC Managed Care – PPO | Attending: Internal Medicine

## 2020-02-03 DIAGNOSIS — Z23 Encounter for immunization: Secondary | ICD-10-CM

## 2020-02-03 NOTE — Progress Notes (Signed)
   Covid-19 Vaccination Clinic  Name:  Shawn Phillips    MRN: [id number] DOB: [birth year]  02/03/2020  [name] was observed post Covid-19 immunization for 15 minutes without incident. He was provided with Vaccine Information Sheet and instruction to access the V-Safe system.   [name] was instructed to call 911 with any severe reactions post vaccine: Marland Kitchen Difficulty breathing  . Swelling of face and throat  . A fast heartbeat  . A bad rash all over body  . Dizziness and weakness   Immunizations Administered    Name Date Dose VIS Date Route   Pfizer COVID-19 Vaccine 02/03/2020  8:58 AM 0.3 mL 11/25/2018 Intramuscular   Manufacturer: [place]   Lot: [id number]   [place]: [id number]

## 2020-03-24 ENCOUNTER — Encounter: Payer: Self-pay | Admitting: Urology

## 2020-03-24 ENCOUNTER — Ambulatory Visit (INDEPENDENT_AMBULATORY_CARE_PROVIDER_SITE_OTHER): Payer: BC Managed Care – PPO | Admitting: Urology

## 2020-03-24 ENCOUNTER — Other Ambulatory Visit: Payer: Self-pay

## 2020-03-24 VITALS — BP 139/77 | HR 51 | Ht 67.0 in | Wt 164.0 lb

## 2020-03-24 DIAGNOSIS — N138 Other obstructive and reflux uropathy: Secondary | ICD-10-CM

## 2020-03-24 DIAGNOSIS — N401 Enlarged prostate with lower urinary tract symptoms: Secondary | ICD-10-CM | POA: Diagnosis not present

## 2020-03-24 DIAGNOSIS — R339 Retention of urine, unspecified: Secondary | ICD-10-CM

## 2020-03-24 LAB — BLADDER SCAN AMB NON-IMAGING: Scan Result: 136

## 2020-03-24 NOTE — Progress Notes (Signed)
03/24/2020 2:53 PM   Shawn Phillips 1956-03-09 [id number]  Referring provider: Dion Body, MD [place] [facility] [place],  [place] [address]  Chief Complaint  Patient presents with  . Benign Prostatic Hypertrophy    HPI: Shawn Phillips is a 64 y.o. male seen at the request of Dr. Netty Starring for evaluation of BPH  -Developed lower urinary tract symptoms 2-3 years ago  -started on tamsulosin with improvement in his voiding pattern. -2-3 month history of worsening voiding symptoms consisting of intermittent urinary stream, weak urinary stream sensation of incomplete emptying and urinary frequency -IPSS 17/35; bother 5/6 -Tamsulosin increased 0.8 mg without symptom improvement -Denies dysuria, gross hematuria -No flank, abdominal, pelvic pain -No recurrent UTI   PMH: Past Medical History:  Diagnosis Date  . Allergic rhinitis   . Arthritis    right knee  . Complication of anesthesia   . Diverticulosis of colon   . Hypothyroidism   . Malignant hyperthermia     Surgical History: Past Surgical History:  Procedure Laterality Date  . ANTERIOR CRUCIATE LIGAMENT REPAIR     Left  . BACK SURGERY  1980   lumbar disk  . KNEE ARTHROPLASTY Right 10/21/2019   Procedure: RIGHT COMPUTER ASSISTED TOTAL KNEE ARTHROPLASTY;  Surgeon: Dereck Leep, MD;  Location: [facility] ORS;  Service: Orthopedics;  Laterality: Right;  . KNEE ARTHROSCOPY  1984   Right  . LASIK  2001  . NASAL SINUS SURGERY  2010   left side, chronic septal perforation    Home Medications:  Allergies as of 03/24/2020      Reactions   Other Other (See Comments)   Anesthesia gases---malignant hyperthermia      Medication List       Accurate as of March 24, 2020  2:53 PM. If you have any questions, ask your nurse or doctor.        celecoxib 200 MG capsule Commonly known as: CeleBREX Take 1 capsule (200 mg total) by mouth 2 (two) times daily.   docusate sodium 100 MG  capsule Commonly known as: COLACE Take 200 mg by mouth 2 (two) times daily as needed (CONSTIPATION.).   enoxaparin 40 MG/0.4ML injection Commonly known as: LOVENOX Inject 0.4 mLs (40 mg total) into the skin daily.   fluticasone 50 MCG/ACT nasal spray Commonly known as: FLONASE Place 2 sprays into the nose daily. What changed:   how much to take  when to take this   loratadine 10 MG tablet Commonly known as: CLARITIN Take 10-20 mg by mouth daily as needed for allergies.   MULTIVITAMIN ADULTS PO Take 1 packet by mouth daily.   naproxen sodium 220 MG tablet Commonly known as: ALEVE Take 440 mg by mouth 2 (two) times daily as needed (knee pain).   oxyCODONE 5 MG immediate release tablet Commonly known as: Roxicodone Take 1-2 tablets (5-10 mg total) by mouth every 4 (four) hours as needed for severe pain.   Proctosol HC 2.5 % rectal cream Generic drug: hydrocortisone Place 1 application rectally 2 (two) times daily as needed for hemorrhoids.   Synthroid 125 MCG tablet Generic drug: levothyroxine TAKE 1 TABLET BY MOUTH EVERY DAY**NEEDS OFFICE VISIT** What changed: See the new instructions.   tamsulosin 0.4 MG Caps capsule Commonly known as: FLOMAX Take 0.4 mg by mouth at bedtime.   traMADol 50 MG tablet Commonly known as: Ultram Take 1 tablet (50 mg total) by mouth every 6 (six) hours as needed for moderate pain.  Allergies:  Allergies  Allergen Reactions  . Other Other (See Comments)    Anesthesia gases---malignant hyperthermia    Family History: Family History  Problem Relation Age of Onset  . Hypertension Mother   . Diabetes Mother   . Heart disease Mother   . Healthy Father   . Diabetes Maternal Aunt   . COPD Paternal Uncle   . Coronary artery disease Paternal Uncle   . Cancer Other        Both sides in smokers    Social History:  reports that he has never smoked. He has never used smokeless tobacco. He reports current alcohol use. He  reports that he does not use drugs.   Physical Exam: BP 139/77   Pulse (!) 51   Ht 5\' 7"  (1.702 m)   Wt 163 lb 15.4 oz (74.4 kg)   BMI 25.68 kg/m   Constitutional:  Alert and oriented, No acute distress. HEENT: [place] AT, moist mucus membranes.  Trachea midline, no masses. Cardiovascular: No clubbing, cyanosis, or edema. RRR Respiratory: Normal respiratory effort, no increased work of breathing. Clear GI: Abdomen is soft, nontender, nondistended, no abdominal masses GU: Prostate 50 g, smooth without nodules Skin: No rashes, bruises or suspicious lesions. Neurologic: Grossly intact, no focal deficits, moving all 4 extremities. Psychiatric: Normal mood and affect.  Laboratory Data:  Lab Results  Component Value Date   PSA 0.42 02/10/2013   PSA 0.43 08/27/2011   PSA 0.47 05/30/2010    Assessment & Plan:    1.  BPH with lower urinary tract symptoms -Moderate LUTS -PVR by bladder scan 136 mL -No improvement on tamsulosin 0.8 mg -Additional management options discussed; further medical management with addition of 5-ARI medication reviewed however he was informed it would take 4-6 months to see efficacy.  Low-dose tadalafil daily was also discussed -Surgical options were also reviewed including TURP, PVP, HoLEP as well as minimally invasive options including UroLift. The pros and cons of each treatment were discussed. -He was most interested in UroLift -Will schedule cystoscopy and TRUS prostate for volume to determine if he is a UroLift candidate   Abbie Sons, [place] [address], [place], [place] [address] [phone]

## 2020-03-25 LAB — MICROSCOPIC EXAMINATION
Bacteria, UA: NONE SEEN
Epithelial Cells (non renal): NONE SEEN /hpf (ref 0–10)
RBC, Urine: NONE SEEN /hpf (ref 0–2)

## 2020-03-25 LAB — URINALYSIS, COMPLETE
Bilirubin, UA: NEGATIVE
Glucose, UA: NEGATIVE
Ketones, UA: NEGATIVE
Leukocytes,UA: NEGATIVE
Nitrite, UA: NEGATIVE
Protein,UA: NEGATIVE
RBC, UA: NEGATIVE
Specific Gravity, UA: 1.02 (ref 1.005–1.030)
Urobilinogen, Ur: 0.2 mg/dL (ref 0.2–1.0)
pH, UA: 5 (ref 5.0–7.5)

## 2020-03-30 ENCOUNTER — Encounter: Payer: Self-pay | Admitting: Urology

## 2020-03-31 ENCOUNTER — Ambulatory Visit (INDEPENDENT_AMBULATORY_CARE_PROVIDER_SITE_OTHER): Payer: BC Managed Care – PPO | Admitting: Urology

## 2020-03-31 ENCOUNTER — Other Ambulatory Visit: Payer: Self-pay

## 2020-03-31 ENCOUNTER — Encounter: Payer: Self-pay | Admitting: Urology

## 2020-03-31 ENCOUNTER — Other Ambulatory Visit: Payer: Self-pay | Admitting: Radiology

## 2020-03-31 VITALS — BP 167/87 | HR 51 | Ht 67.0 in | Wt 162.0 lb

## 2020-03-31 DIAGNOSIS — N35919 Unspecified urethral stricture, male, unspecified site: Secondary | ICD-10-CM | POA: Diagnosis not present

## 2020-03-31 DIAGNOSIS — N138 Other obstructive and reflux uropathy: Secondary | ICD-10-CM | POA: Diagnosis not present

## 2020-03-31 DIAGNOSIS — N401 Enlarged prostate with lower urinary tract symptoms: Secondary | ICD-10-CM

## 2020-03-31 NOTE — Progress Notes (Signed)
   03/31/20  CC:  Chief Complaint  Patient presents with  . Cysto    HPI: See note 03/24/2020  Blood pressure (!) 167/87, pulse (!) 51, height 5\' 7"  (1.702 m), weight 162 lb (73.5 kg). NED. A&Ox3.   No respiratory distress   Abd soft, NT, ND Normal phallus with bilateral descended testicles  Cystoscopy Procedure Note  Patient identification was confirmed, informed consent was obtained, and patient was prepped using Betadine solution.  Lidocaine jelly was administered per urethral meatus.     Pre-Procedure: - Inspection reveals a normal caliber urethral meatus.  Procedure: The flexible cystoscope was introduced without difficulty -A stricture was noted in the mid penile urethra and the cystoscope was unable to be advanced further  Post-Procedure: - Patient tolerated the procedure well  Assessment/ Plan:  1.  Penile urethral stricture  Findings were discussed in detail  His voiding symptoms are most likely secondary to the stricture and not BPH  Recommend scheduling balloon dilation of the stricture under anesthesia and possible DVIU  We discussed the possibility of recurrent stricture and the need for continued monitoring  He indicated all questions were answered and desires to proceed   Abbie Sons, MD

## 2020-03-31 NOTE — H&P (View-Only) (Signed)
   03/31/20  CC:  Chief Complaint  Patient presents with  . Cysto    HPI: See note 03/24/2020  Blood pressure (!) 167/87, pulse (!) 51, height 5\' 7"  (1.702 m), weight 162 lb (73.5 kg). NED. A&Ox3.   No respiratory distress   Abd soft, NT, ND Normal phallus with bilateral descended testicles  Cystoscopy Procedure Note  Patient identification was confirmed, informed consent was obtained, and patient was prepped using Betadine solution.  Lidocaine jelly was administered per urethral meatus.     Pre-Procedure: - Inspection reveals a normal caliber urethral meatus.  Procedure: The flexible cystoscope was introduced without difficulty -A stricture was noted in the mid penile urethra and the cystoscope was unable to be advanced further  Post-Procedure: - Patient tolerated the procedure well  Assessment/ Plan:  1.  Penile urethral stricture  Findings were discussed in detail  His voiding symptoms are most likely secondary to the stricture and not BPH  Recommend scheduling balloon dilation of the stricture under anesthesia and possible DVIU  We discussed the possibility of recurrent stricture and the need for continued monitoring  He indicated all questions were answered and desires to proceed   Abbie Sons, MD

## 2020-03-31 NOTE — Progress Notes (Signed)
Transrectal ultrasound prostate  An 8 Hz transrectal sound probe was lubricated and gently passed per rectum.  Imaging was obtained in transverse and sagittal views.  Marked prostatic calcifications were noted.  Prostate volume was calculated at 35 cc.   Impression: Mild prostatic enlargement

## 2020-04-01 LAB — URINALYSIS, COMPLETE
Bilirubin, UA: NEGATIVE
Glucose, UA: NEGATIVE
Ketones, UA: NEGATIVE
Leukocytes,UA: NEGATIVE
Nitrite, UA: NEGATIVE
Protein,UA: NEGATIVE
RBC, UA: NEGATIVE
Specific Gravity, UA: 1.02 (ref 1.005–1.030)
Urobilinogen, Ur: 0.2 mg/dL (ref 0.2–1.0)
pH, UA: 7 (ref 5.0–7.5)

## 2020-04-01 LAB — MICROSCOPIC EXAMINATION
Bacteria, UA: NONE SEEN
RBC, Urine: NONE SEEN /hpf (ref 0–2)

## 2020-04-01 NOTE — Progress Notes (Signed)
03/31/2020 5:33 PM   [name] 1955/12/28 [id number]  Referring provider: Dion Body, MD [place] [facility] [place],  [place] [address]  Chief Complaint  Patient presents with  . Cysto    HPI: 64 y.o. male with obstructive voiding symptoms who was interested in UroLift.  Cystoscopy performed today was remarkable for a penile urethral stricture.  He is scheduled for balloon dilation of the urethral stricture, possible DVIU   PMH: Past Medical History:  Diagnosis Date  . Allergic rhinitis   . Arthritis    right knee  . Complication of anesthesia   . Diverticulosis of colon   . Hypothyroidism   . Malignant hyperthermia     Surgical History: Past Surgical History:  Procedure Laterality Date  . ANTERIOR CRUCIATE LIGAMENT REPAIR     Left  . BACK SURGERY  1980   lumbar disk  . KNEE ARTHROPLASTY Right 10/21/2019   Procedure: RIGHT COMPUTER ASSISTED TOTAL KNEE ARTHROPLASTY;  Surgeon: Dereck Leep, MD;  Location: [facility] ORS;  Service: Orthopedics;  Laterality: Right;  . KNEE ARTHROSCOPY  1984   Right  . LASIK  2001  . NASAL SINUS SURGERY  2010   left side, chronic septal perforation    Home Medications:  Allergies as of 03/31/2020      Reactions   Other Other (See Comments)   Anesthesia gases---malignant hyperthermia      Medication List       Accurate as of March 31, 2020 11:59 PM. If you have any questions, ask your nurse or doctor.        celecoxib 200 MG capsule Commonly known as: CeleBREX Take 1 capsule (200 mg total) by mouth 2 (two) times daily.   docusate sodium 100 MG capsule Commonly known as: COLACE Take 200 mg by mouth 2 (two) times daily as needed (CONSTIPATION.).   enoxaparin 40 MG/0.4ML injection Commonly known as: LOVENOX Inject 0.4 mLs (40 mg total) into the skin daily.   fluticasone 50 MCG/ACT nasal spray Commonly known as: FLONASE Place 2 sprays into the nose daily. What changed:   how much to  take  when to take this   loratadine 10 MG tablet Commonly known as: CLARITIN Take 10-20 mg by mouth daily as needed for allergies.   MULTIVITAMIN ADULTS PO Take 1 packet by mouth daily.   oxyCODONE 5 MG immediate release tablet Commonly known as: Roxicodone Take 1-2 tablets (5-10 mg total) by mouth every 4 (four) hours as needed for severe pain.   Proctosol HC 2.5 % rectal cream Generic drug: hydrocortisone Place 1 application rectally 2 (two) times daily as needed for hemorrhoids.   Synthroid 125 MCG tablet Generic drug: levothyroxine TAKE 1 TABLET BY MOUTH EVERY DAY**NEEDS OFFICE VISIT** What changed: See the new instructions.   tamsulosin 0.4 MG Caps capsule Commonly known as: FLOMAX Take 0.8 mg by mouth at bedtime.   traMADol 50 MG tablet Commonly known as: Ultram Take 1 tablet (50 mg total) by mouth every 6 (six) hours as needed for moderate pain.       Allergies:  Allergies  Allergen Reactions  . Other Other (See Comments)    Anesthesia gases---malignant hyperthermia    Family History: Family History  Problem Relation Age of Onset  . Hypertension Mother   . Diabetes Mother   . Heart disease Mother   . Healthy Father   . Diabetes Maternal Aunt   . COPD Paternal Uncle   . Coronary artery disease  Paternal Uncle   . Cancer Other        Both sides in smokers    Social History:  reports that he has never smoked. He has never used smokeless tobacco. He reports current alcohol use. He reports that he does not use drugs.   Physical Exam: BP (!) 167/87   Pulse (!) 51   Ht 5\' 7"  (1.702 m)   Wt 162 lb (73.5 kg)   BMI 25.37 kg/m   Constitutional:  Alert and oriented, No acute distress. HEENT: [place] AT, moist mucus membranes.  Trachea midline, no masses. Cardiovascular: No clubbing, cyanosis, or edema.  RRR Respiratory: Normal respiratory effort, no increased work of breathing.  Lungs clear GI: Abdomen is soft, nontender, nondistended, no abdominal  masses GU: No CVA tenderness Lymph: No cervical or inguinal lymphadenopathy. Skin: No rashes, bruises or suspicious lesions. Neurologic: Grossly intact, no focal deficits, moving all 4 extremities. Psychiatric: Normal mood and affect.   Assessment & Plan:    1.  Penile urethral stricture  Findings were discussed in detail  Scheduled for balloon dilation/possible DVIU  Risks discussed as per the cystoscopy note this date  Abbie Sons, MD  [place] [address], [place], [place] [address] [phone]

## 2020-04-01 NOTE — H&P (View-Only) (Signed)
03/31/2020 5:33 PM   Shawn Phillips 1956/05/14 [id number]  Referring provider: Dion Body, MD [place] [facility] [address] [place],  [place] [address]  Chief Complaint  Patient presents with  . Cysto    HPI: 64 y.o. male with obstructive voiding symptoms who was interested in UroLift.  Cystoscopy performed today was remarkable for a penile urethral stricture.  He is scheduled for balloon dilation of the urethral stricture, possible DVIU   PMH: Past Medical History:  Diagnosis Date  . Allergic rhinitis   . Arthritis    right knee  . Complication of anesthesia   . Diverticulosis of colon   . Hypothyroidism   . Malignant hyperthermia     Surgical History: Past Surgical History:  Procedure Laterality Date  . ANTERIOR CRUCIATE LIGAMENT REPAIR     Left  . BACK SURGERY  1980   lumbar disk  . KNEE ARTHROPLASTY Right 10/21/2019   Procedure: RIGHT COMPUTER ASSISTED TOTAL KNEE ARTHROPLASTY;  Surgeon: Dereck Leep, MD;  Location: [facility] ORS;  Service: Orthopedics;  Laterality: Right;  . KNEE ARTHROSCOPY  1984   Right  . LASIK  2001  . NASAL SINUS SURGERY  2010   left side, chronic septal perforation    Home Medications:  Allergies as of 03/31/2020      Reactions   Other Other (See Comments)   Anesthesia gases---malignant hyperthermia      Medication List       Accurate as of March 31, 2020 11:59 PM. If you have any questions, ask your nurse or doctor.        celecoxib 200 MG capsule Commonly known as: CeleBREX Take 1 capsule (200 mg total) by mouth 2 (two) times daily.   docusate sodium 100 MG capsule Commonly known as: COLACE Take 200 mg by mouth 2 (two) times daily as needed (CONSTIPATION.).   enoxaparin 40 MG/0.4ML injection Commonly known as: LOVENOX Inject 0.4 mLs (40 mg total) into the skin daily.   fluticasone 50 MCG/ACT nasal spray Commonly known as: FLONASE Place 2 sprays into the nose daily. What changed:   how much to  take  when to take this   loratadine 10 MG tablet Commonly known as: CLARITIN Take 10-20 mg by mouth daily as needed for allergies.   MULTIVITAMIN ADULTS PO Take 1 packet by mouth daily.   oxyCODONE 5 MG immediate release tablet Commonly known as: Roxicodone Take 1-2 tablets (5-10 mg total) by mouth every 4 (four) hours as needed for severe pain.   Proctosol HC 2.5 % rectal cream Generic drug: hydrocortisone Place 1 application rectally 2 (two) times daily as needed for hemorrhoids.   Synthroid 125 MCG tablet Generic drug: levothyroxine TAKE 1 TABLET BY MOUTH EVERY DAY**NEEDS OFFICE VISIT** What changed: See the new instructions.   tamsulosin 0.4 MG Caps capsule Commonly known as: FLOMAX Take 0.8 mg by mouth at bedtime.   traMADol 50 MG tablet Commonly known as: Ultram Take 1 tablet (50 mg total) by mouth every 6 (six) hours as needed for moderate pain.       Allergies:  Allergies  Allergen Reactions  . Other Other (See Comments)    Anesthesia gases---malignant hyperthermia    Family History: Family History  Problem Relation Age of Onset  . Hypertension Mother   . Diabetes Mother   . Heart disease Mother   . Healthy Father   . Diabetes Maternal Aunt   . COPD Paternal Uncle   . Coronary artery disease  Paternal Uncle   . Cancer Other        Both sides in smokers    Social History:  reports that he has never smoked. He has never used smokeless tobacco. He reports current alcohol use. He reports that he does not use drugs.   Physical Exam: BP (!) 167/87   Pulse (!) 51   Ht 5\' 7"  (1.702 m)   Wt 162 lb (73.5 kg)   BMI 25.37 kg/m   Constitutional:  Alert and oriented, No acute distress. HEENT: [place] AT, moist mucus membranes.  Trachea midline, no masses. Cardiovascular: No clubbing, cyanosis, or edema.  RRR Respiratory: Normal respiratory effort, no increased work of breathing.  Lungs clear GI: Abdomen is soft, nontender, nondistended, no abdominal  masses GU: No CVA tenderness Lymph: No cervical or inguinal lymphadenopathy. Skin: No rashes, bruises or suspicious lesions. Neurologic: Grossly intact, no focal deficits, moving all 4 extremities. Psychiatric: Normal mood and affect.   Assessment & Plan:    1.  Penile urethral stricture  Findings were discussed in detail  Scheduled for balloon dilation/possible DVIU  Risks discussed as per the cystoscopy note this date  Abbie Sons, MD  [place] [address], [place], [place] [address] [phone]

## 2020-04-04 LAB — CULTURE, URINE COMPREHENSIVE

## 2020-04-06 ENCOUNTER — Other Ambulatory Visit: Payer: Self-pay

## 2020-04-06 ENCOUNTER — Encounter
Admission: RE | Admit: 2020-04-06 | Discharge: 2020-04-06 | Disposition: A | Discharge status: Home / Self Care | Payer: BC Managed Care – PPO | Source: Ambulatory Visit | Attending: Urology | Admitting: Urology

## 2020-04-06 NOTE — Patient Instructions (Signed)
COVID TESTING Date: April 08, 2020 Friday  Testing site:  [facility] [address] Thru Hours:  2:70 am - 1:00 pm Once you are tested, you are asked to stay quarantined (avoiding public places) until after your surgery.   Your procedure is scheduled on: April 12, 2020 Tuesday  Report to Day Surgery on the 2nd floor of the [facility]. To find out your arrival time, please call [phone] between 1PM - 3PM on: April 11, 2020 MONDAY  REMEMBER: Instructions that are not followed completely may result in serious medical risk, up to and including death; or upon the discretion of your surgeon and anesthesiologist your surgery may need to be rescheduled.  Do not eat food after midnight the night before surgery.  No gum chewing, lozengers or hard candies.  You may however, drink CLEAR liquids up to 2 hours before you are scheduled to arrive for your surgery. Do not drink anything within 2 hours of your scheduled arrival time.  Clear liquids include: - water  - apple juice without pulp - gatorade (not RED) - black coffee or tea (Do NOT add milk or creamers to the coffee or tea) Do NOT drink anything that is not on this list.  Type 1 and Type 2 diabetics should only drink water.   TAKE THESE MEDICATIONS THE MORNING OF SURGERY WITH A SIP OF WATER: CELEBREX SYNTHROID   Use FLONASE  Stop Anti-inflammatories (NSAIDS) such as Advil, Aleve, Ibuprofen, Motrin, Naproxen, Naprosyn and Aspirin based products such as Excedrin, Goodys Powder, BC Powder. (May take Tylenol or Acetaminophen if needed.)  Stop ANY OVER THE COUNTER supplements until after surgery. (May continue Vitamin D, Vitamin B, and multivitamin.)  No Alcohol for 24 hours before or after surgery.  No Smoking including e-cigarettes for 24 hours prior to surgery.  No chewable tobacco products for at least 6 hours prior to surgery.  No nicotine patches on the day of surgery.  Do not use any  "recreational" drugs for at least a week prior to your surgery.  Please be advised that the combination of cocaine and anesthesia may have negative outcomes, up to and including death. If you test positive for cocaine, your surgery will be cancelled.  On the morning of surgery brush your teeth with toothpaste and water, you may rinse your mouth with mouthwash if you wish. Do not swallow any toothpaste or mouthwash.  Do not wear jewelry, make-up, hairpins, clips or nail polish.  Do not wear lotions, powders, or perfumes.   Do not shave 48 hours prior to surgery.   Contact lenses, hearing aids and dentures may not be worn into surgery.  Do not bring valuables to the hospital. [facility] is not responsible for any missing/lost belongings or valuables.   SHOWER DAY OF SURGERY  Notify your doctor if there is any change in your medical condition (cold, fever, infection).  Wear comfortable clothing (specific to your surgery type) to the hospital.  Plan for stool softeners for home use; pain medications have a tendency to cause constipation. You can also help prevent constipation by eating foods high in fiber such as fruits and vegetables and drinking plenty of fluids as your diet allows.  After surgery, you can help prevent lung complications by doing breathing exercises.  Take deep breaths and cough every 1-2 hours. Your doctor may order a device called an Incentive Spirometer to help you take deep breaths. When coughing or sneezing, hold a pillow firmly against  your incision with both hands. This is called "splinting." Doing this helps protect your incision. It also decreases belly discomfort.  If you are being admitted to the hospital overnight, leave your suitcase in the car. After surgery it may be brought to your room.  If you are being discharged the day of surgery, you will not be allowed to drive home. You will need a responsible adult (18 years or older) to drive you home and  stay with you that night.   If you are taking public transportation, you will need to have a responsible adult (18 years or older) with you. Please confirm with your physician that it is acceptable to use public transportation.   Please call the [facility] Dept. at [phone] if you have any questions about these instructions.  Visitation Policy:  Patients undergoing a surgery or procedure may have one family member or support person with them as long as that person is not COVID-19 positive or experiencing its symptoms.  That person may remain in the waiting area during the procedure.  Children under 5 years of age may have both parents or legal guardians with them during their procedure.  Inpatient Visitation Update:   Two designated support people may visit a patient during visiting hours 7 am to 8 pm. It must be the same two designated people for the duration of the patient stay. The visitors may come and go during the day, and there is no switching out to have different visitors. A mask must be worn at all times, including in the patient room.  Children under 56 years of age:  a total of 4 designated visitors for the child's entire stay are allowed. Only 2 in the room at a time and only one staying overnight at a time. The overnight guest can now rotate during the child's hospital stay.  As a reminder, masks are still required for all [facility] team members, patients and visitors in all [facility] facilities.   Systemwide, no visitors 17 or younger.

## 2020-04-07 NOTE — Progress Notes (Signed)
  [facility] Perioperative Services: Pre-Admission/Anesthesia Testing      Date: 04/07/20  Name: WENDELL NICOSON MRN:   [id number]  Re: MALIGNANT HYPERTHERMIA  NOTE:  Anesthesia team is aware of this history. Patient underwent spinal anesthesia for knee arthroscopy on 10/20/2018 (ASA III) with no documented complications.   Patient scheduled for procedure (balloon dilation of urethral procedure) on 04/12/2020 with Dr. Bernardo Heater. PMH (+) for malignant hyperthemia. Patient is current scheduled as the second case. I have spoken with urology practice and they would like to proceed with planned procedure as scheduled, which is reasonable as long as the necessary precautions are taken. I have spoken with anesthesiology Andree Elk, MD) regarding options. Given the simplicity of the planned procedure, patient will likely be able to be appropriately anesthetized using heavy MAC or spinal anesthetic course. Proceed with case as planned. Anesthesia to discuss anesthetic course with patient on the day of surgery.  Honor Loh, MSN, APRN, FNP-C, CEN [facility]  Peri-operative Services Nurse Practitioner Phone: [phone] 04/07/20 2:33 PM

## 2020-04-08 ENCOUNTER — Other Ambulatory Visit
Admission: RE | Admit: 2020-04-08 | Discharge: 2020-04-08 | Disposition: A | Discharge status: Home / Self Care | Payer: BC Managed Care – PPO | Source: Ambulatory Visit | Attending: Urology | Admitting: Urology

## 2020-04-08 ENCOUNTER — Other Ambulatory Visit: Payer: Self-pay

## 2020-04-08 DIAGNOSIS — Z20822 Contact with and (suspected) exposure to covid-19: Secondary | ICD-10-CM | POA: Insufficient documentation

## 2020-04-08 DIAGNOSIS — Z01812 Encounter for preprocedural laboratory examination: Secondary | ICD-10-CM | POA: Diagnosis not present

## 2020-04-09 LAB — SARS CORONAVIRUS 2 (TAT 6-24 HRS): SARS Coronavirus 2: NEGATIVE

## 2020-04-11 ENCOUNTER — Encounter: Payer: Self-pay | Admitting: Urology

## 2020-04-12 ENCOUNTER — Ambulatory Visit: Payer: BC Managed Care – PPO

## 2020-04-12 ENCOUNTER — Other Ambulatory Visit: Payer: Self-pay

## 2020-04-12 ENCOUNTER — Ambulatory Visit: Payer: BC Managed Care – PPO | Admitting: Certified Registered Nurse Anesthetist

## 2020-04-12 ENCOUNTER — Encounter
Admission: RE | Disposition: A | Discharge status: Home / Self Care | Payer: Self-pay | Source: Home / Self Care | Attending: Urology

## 2020-04-12 ENCOUNTER — Ambulatory Visit
Admission: RE | Admit: 2020-04-12 | Discharge: 2020-04-12 | Disposition: A | Discharge status: Home / Self Care | Payer: BC Managed Care – PPO | Attending: Urology | Admitting: Urology

## 2020-04-12 ENCOUNTER — Encounter: Payer: Self-pay | Admitting: Urology

## 2020-04-12 DIAGNOSIS — Z791 Long term (current) use of non-steroidal anti-inflammatories (NSAID): Secondary | ICD-10-CM | POA: Diagnosis not present

## 2020-04-12 DIAGNOSIS — Z79890 Hormone replacement therapy (postmenopausal): Secondary | ICD-10-CM | POA: Diagnosis not present

## 2020-04-12 DIAGNOSIS — E039 Hypothyroidism, unspecified: Secondary | ICD-10-CM | POA: Insufficient documentation

## 2020-04-12 DIAGNOSIS — Z79899 Other long term (current) drug therapy: Secondary | ICD-10-CM | POA: Insufficient documentation

## 2020-04-12 DIAGNOSIS — J309 Allergic rhinitis, unspecified: Secondary | ICD-10-CM | POA: Diagnosis not present

## 2020-04-12 DIAGNOSIS — Z7901 Long term (current) use of anticoagulants: Secondary | ICD-10-CM | POA: Diagnosis not present

## 2020-04-12 DIAGNOSIS — Z96651 Presence of right artificial knee joint: Secondary | ICD-10-CM | POA: Insufficient documentation

## 2020-04-12 DIAGNOSIS — N35914 Unspecified anterior urethral stricture, male: Secondary | ICD-10-CM | POA: Diagnosis present

## 2020-04-12 DIAGNOSIS — Z419 Encounter for procedure for purposes other than remedying health state, unspecified: Secondary | ICD-10-CM

## 2020-04-12 DIAGNOSIS — N35919 Unspecified urethral stricture, male, unspecified site: Secondary | ICD-10-CM

## 2020-04-12 HISTORY — PX: CYSTOSCOPY WITH URETHRAL DILATATION: SHX5125

## 2020-04-12 HISTORY — PX: BALLOON DILATION: SHX5330

## 2020-04-12 SURGERY — BALLOON DILATION
Anesthesia: General | Site: Urethra

## 2020-04-12 MED ORDER — PROPOFOL 10 MG/ML IV BOLUS
INTRAVENOUS | Status: DC | PRN
  Administered 2020-04-12: 20 mg via INTRAVENOUS
  Administered 2020-04-12 (×2): 30 mg via INTRAVENOUS

## 2020-04-12 MED ORDER — PROPOFOL 500 MG/50ML IV EMUL
INTRAVENOUS | Status: DC | PRN
  Administered 2020-04-12: 150 ug/kg/min via INTRAVENOUS

## 2020-04-12 MED ORDER — KETAMINE HCL 50 MG/ML IJ SOLN
INTRAMUSCULAR | Status: AC
  Filled 2020-04-12: qty 10

## 2020-04-12 MED ORDER — FENTANYL CITRATE (PF) 100 MCG/2ML IJ SOLN
INTRAMUSCULAR | Status: DC | PRN
  Administered 2020-04-12: 50 ug via INTRAVENOUS
  Administered 2020-04-12 (×2): 25 ug via INTRAVENOUS

## 2020-04-12 MED ORDER — MIDAZOLAM HCL 2 MG/2ML IJ SOLN
INTRAMUSCULAR | Status: AC
  Filled 2020-04-12: qty 2

## 2020-04-12 MED ORDER — CEFAZOLIN SODIUM-DEXTROSE 2-4 GM/100ML-% IV SOLN
INTRAVENOUS | Status: AC
  Filled 2020-04-12: qty 100

## 2020-04-12 MED ORDER — CHLORHEXIDINE GLUCONATE 0.12 % MT SOLN
OROMUCOSAL | Status: AC
  Filled 2020-04-12: qty 15

## 2020-04-12 MED ORDER — IOHEXOL 180 MG/ML  SOLN
INTRAMUSCULAR | Status: DC | PRN
  Administered 2020-04-12: 40 mL

## 2020-04-12 MED ORDER — FAMOTIDINE 20 MG PO TABS
20.0000 mg | ORAL_TABLET | Freq: Once | ORAL | Status: AC
  Administered 2020-04-12: 20 mg via ORAL

## 2020-04-12 MED ORDER — LACTATED RINGERS IV SOLN: INTRAVENOUS | Status: DC

## 2020-04-12 MED ORDER — FENTANYL CITRATE (PF) 100 MCG/2ML IJ SOLN
INTRAMUSCULAR | Status: AC
  Filled 2020-04-12: qty 2

## 2020-04-12 MED ORDER — HYDROCODONE-ACETAMINOPHEN 5-325 MG PO TABS: 1.0000 | ORAL_TABLET | Freq: Four times a day (QID) | ORAL | 0 refills | Status: DC | PRN

## 2020-04-12 MED ORDER — ORAL CARE MOUTH RINSE: 15.0000 mL | Freq: Once | OROMUCOSAL | Status: AC

## 2020-04-12 MED ORDER — GLYCOPYRROLATE 0.2 MG/ML IJ SOLN
INTRAMUSCULAR | Status: DC | PRN
Start: 2020-04-12 — End: 2020-04-12
  Administered 2020-04-12: .2 mg via INTRAVENOUS

## 2020-04-12 MED ORDER — PROPOFOL 500 MG/50ML IV EMUL
INTRAVENOUS | Status: AC
  Filled 2020-04-12: qty 50

## 2020-04-12 MED ORDER — CHLORHEXIDINE GLUCONATE 0.12 % MT SOLN
15.0000 mL | Freq: Once | OROMUCOSAL | Status: AC
  Administered 2020-04-12: 15 mL via OROMUCOSAL

## 2020-04-12 MED ORDER — CEFAZOLIN SODIUM-DEXTROSE 2-4 GM/100ML-% IV SOLN
2.0000 g | INTRAVENOUS | Status: AC
  Administered 2020-04-12: 2 g via INTRAVENOUS

## 2020-04-12 MED ORDER — LIDOCAINE HCL (CARDIAC) PF 100 MG/5ML IV SOSY
PREFILLED_SYRINGE | INTRAVENOUS | Status: DC | PRN
Start: 2020-04-12 — End: 2020-04-12
  Administered 2020-04-12: 80 mg via INTRATRACHEAL

## 2020-04-12 MED ORDER — ONDANSETRON HCL 4 MG/2ML IJ SOLN
INTRAMUSCULAR | Status: DC | PRN
  Administered 2020-04-12: 4 mg via INTRAVENOUS

## 2020-04-12 MED ORDER — MIDAZOLAM HCL 2 MG/2ML IJ SOLN
INTRAMUSCULAR | Status: DC | PRN
  Administered 2020-04-12: 2 mg via INTRAVENOUS

## 2020-04-12 MED ORDER — ONDANSETRON HCL 4 MG/2ML IJ SOLN: 4.0000 mg | Freq: Once | INTRAMUSCULAR | Status: DC | PRN

## 2020-04-12 MED ORDER — FAMOTIDINE 20 MG PO TABS
ORAL_TABLET | ORAL | Status: AC
  Filled 2020-04-12: qty 1

## 2020-04-12 MED ORDER — DEXAMETHASONE SODIUM PHOSPHATE 10 MG/ML IJ SOLN
INTRAMUSCULAR | Status: DC | PRN
  Administered 2020-04-12: 10 mg via INTRAVENOUS

## 2020-04-12 MED ORDER — FENTANYL CITRATE (PF) 100 MCG/2ML IJ SOLN: 25.0000 ug | INTRAMUSCULAR | Status: DC | PRN

## 2020-04-12 SURGICAL SUPPLY — 32 items
BAG DRAIN CYSTO-URO LG1000N (MISCELLANEOUS) ×3 IMPLANT
BAG URINE DRAIN 2000ML AR STRL (UROLOGICAL SUPPLIES) ×3 IMPLANT
BALLN URETL DIL 7X10 (BALLOONS) ×3
BALLOON URETL DIL 7X10 (BALLOONS) ×2 IMPLANT
BASIN GRAD PLASTIC 32OZ STRL (MISCELLANEOUS) IMPLANT
BRUSH SCRUB EZ 1% IODOPHOR (MISCELLANEOUS) ×3 IMPLANT
CATH FOL 2WAY LX 16X5 (CATHETERS) ×3 IMPLANT
CATH FOLEY 2W COUNCIL 20FR 5CC (CATHETERS) IMPLANT
CATH SET URETHRAL DILATOR (CATHETERS) IMPLANT
CATH URETL 5X70 OPEN END (CATHETERS) ×3 IMPLANT
CONRAY 43 FOR UROLOGY 50M (MISCELLANEOUS) ×3 IMPLANT
DRAPE 3/4 80X56 (DRAPES) ×3 IMPLANT
ELECT REM PT RETURN 9FT ADLT (ELECTROSURGICAL) ×3
ELECTRODE REM PT RTRN 9FT ADLT (ELECTROSURGICAL) ×2 IMPLANT
GLIDEWIRE STR 0.035 150CM 3CM (WIRE) ×3 IMPLANT
GLOVE BIOGEL PI IND STRL 7.5 (GLOVE) ×2 IMPLANT
GLOVE BIOGEL PI INDICATOR 7.5 (GLOVE) ×1
GOWN STRL REUS W/ TWL XL LVL3 (GOWN DISPOSABLE) ×4 IMPLANT
GOWN STRL REUS W/TWL XL LVL3 (GOWN DISPOSABLE) ×6
GUIDEWIRE STR DUAL SENSOR (WIRE) ×3 IMPLANT
HOLDER FOLEY CATH W/STRAP (MISCELLANEOUS) ×3 IMPLANT
KIT TURNOVER CYSTO (KITS) ×3 IMPLANT
PACK CYSTO AR (MISCELLANEOUS) ×3 IMPLANT
SET CYSTO W/LG BORE CLAMP LF (SET/KITS/TRAYS/PACK) ×3 IMPLANT
SOL PREP PVP 2OZ (MISCELLANEOUS)
SOLUTION PREP PVP 2OZ (MISCELLANEOUS) IMPLANT
SURGILUBE 2OZ TUBE FLIPTOP (MISCELLANEOUS) ×3 IMPLANT
SYR 30ML LL (SYRINGE) ×3 IMPLANT
SYR TOOMEY 50ML (SYRINGE) IMPLANT
SYRINGE IRR TOOMEY STRL 70CC (SYRINGE) ×3 IMPLANT
WATER STERILE IRR 1000ML POUR (IV SOLUTION) ×3 IMPLANT
WATER STERILE IRR 3000ML UROMA (IV SOLUTION) ×3 IMPLANT

## 2020-04-12 NOTE — Op Note (Signed)
Preoperative diagnosis:  1. Anterior urethral stricture  Postoperative diagnosis:  1. Same  Procedure: 1. Balloon dilation anterior urethral stricture  Surgeon: Abbie Sons, MD  Anesthesia: MAC  Complications: None  Intraoperative findings: <1 cm anterior urethral stricture  EBL: Minimal  Specimens: None  Indication: Shawn Phillips is a 64 y.o. patient who initially presented with bothersome lower urinary tract symptoms and was interested and a UroLift evaluation.  He was on tamsulosin 0.8 mg daily without improvement in his symptoms.  Office cystoscopy remarkable for a anterior urethral stricture.  After reviewing the management options for treatment, he elected to proceed with the above surgical procedure(s). We have discussed the potential benefits and risks of the procedure, side effects of the proposed treatment, the likelihood of the patient achieving the goals of the procedure, and any potential problems that might occur during the procedure or recuperation. Informed consent has been obtained.  Description of procedure:  The patient was taken to the operating room and sedation was obtained by anesthesia.  The patient was placed in the dorsal lithotomy position, prepped and draped in the usual sterile fashion, and preoperative antibiotics were administered. A preoperative time-out was performed.   A 21 French cystoscope was lubricated and passed per urethra.  In the midportion of the penile urethra a pinpoint stricture was noted.  A 0.038 Sensor wire and 0.25 Glidewire would not advance beyond the stricture.  The cystoscope was removed and a 4.5 French semirigid ureteroscope was then passed per urethra up to the level of the stricture and the Glidewire was placed through the stricture and advanced into the bladder without difficulty.  Wire position was confirmed by C-arm fluoroscopy.  The ureteroscope was removed and a 5 [place] open-ended ureteral catheter was placed over the  Glidewire which was exchanged for the Sensor wire.  The guidewire was backloaded on the cystoscope which was advanced to the level of the stricture.  A 18 French/10 cm dilating balloon was then placed over the wire and inflated to 18 atm.  No narrowing was noted on the balloon which was kept inflated for 3 minutes then deflated and removed.  The cystoscope was then advanced through the stricture which appeared to be a short stricture.  No additional strictures were noted.  There was mild lateral lobe enlargement and mild to moderate bladder neck elevation.  Panendoscopy was performed and there were no bladder mucosal lesions including solid and papillary lesions.  There was mild trabeculation present.  Ureteral orifices were normal-appearing with clear efflux.  The cystoscope was removed.  A 20 [place] council catheter was placed over the wire without difficulty with return of clear urine.  10 cc of sterile water was placed in the balloon.  He was then transported to the PACU in stable condition.  Plan: Foley catheter will remain indwelling until 04/14/2020 and he will return to the office for removal.   Abbie Sons, M.D.

## 2020-04-12 NOTE — Discharge Instructions (Signed)
AMBULATORY SURGERY  DISCHARGE INSTRUCTIONS   The drugs that you were given will stay in your system until tomorrow so for the next 24 hours you should not:  Drive an automobile Make any legal decisions Drink any alcoholic beverage   You may resume regular meals tomorrow.  Today it is better to start with liquids and gradually work up to solid foods.  You may eat anything you prefer, but it is better to start with liquids, then soup and crackers, and gradually work up to solid foods.   Please notify your doctor immediately if you have any unusual bleeding, trouble breathing, redness and pain at the surgery site, drainage, fever, or pain not relieved by medication.    Additional Instructions:         Please contact your physician with any problems or Same Day Surgery at [phone], Monday through Friday 6 am to 4 pm, or [place] [facility] number at [phone].Blood in the urine is normal  Please call office [phone] for catheter drainage problems  A prescription for pain medication was sent to your pharmacy if needed  If you do not have a follow-up appointment for catheter removal at the time of discharge our office will contact you for a time to remove the catheter on 04/14/2020

## 2020-04-12 NOTE — Transfer of Care (Signed)
Immediate Anesthesia Transfer of Care Note  Patient: Shawn Phillips  Procedure(s) Performed: BALLOON DILATION (N/A ) CYSTOSCOPY WITH URETHRAL DILATATION (N/A Urethra)  Patient Location: PACU  Anesthesia Type:MAC  Level of Consciousness: sedated  Airway & Oxygen Therapy: Patient connected to face mask oxygen  Post-op Assessment: Post -op Vital signs reviewed and stable  Post vital signs: stable  Last Vitals:  Vitals Value Taken Time  BP    Temp 36.2 C 04/12/20 0937  Pulse 56 04/12/20 0939  Resp 11 04/12/20 0939  SpO2 99 % 04/12/20 0939    Last Pain:  Vitals:   04/12/20 0752  TempSrc: Tympanic  PainSc: 0-No pain         Complications: No complications documented.

## 2020-04-12 NOTE — Anesthesia Procedure Notes (Signed)
Procedure Name: MAC Date/Time: 04/12/2020 9:06 AM Performed by: Aline Brochure, CRNA Pre-anesthesia Checklist: Patient identified, Emergency Drugs available, Suction available and Patient being monitored Patient Re-evaluated:Patient Re-evaluated prior to induction Oxygen Delivery Method: Simple face mask

## 2020-04-12 NOTE — Interval H&P Note (Signed)
History and Physical Interval Note: Lungs: Clear CV: RRR  04/12/2020 8:40 AM  Shawn Phillips  has presented today for surgery, with the diagnosis of penile urethral stricture.  The various methods of treatment have been discussed with the patient and family. After consideration of risks, benefits and other options for treatment, the patient has consented to  Procedure(s): BALLOON DILATION (N/A) [place] (N/A) as a surgical intervention.  The patient's history has been reviewed, patient examined, no change in status, stable for surgery.  I have reviewed the patient's chart and labs.  Questions were answered to the patient's satisfaction.     [place]

## 2020-04-12 NOTE — Anesthesia Preprocedure Evaluation (Signed)
Anesthesia Evaluation  Patient identified by MRN, date of birth, ID band Patient awake    Reviewed: Allergy & Precautions, H&P , NPO status , Patient's Chart, lab work & pertinent test results  History of Anesthesia Complications (+) MALIGNANT HYPERTHERMIA and history of anesthetic complications  Airway Mallampati: III  TM Distance: >3 FB Neck ROM: full    Dental  (+) Chipped, Dental Advidsory Given   Pulmonary neg pulmonary ROS, neg shortness of breath,           Cardiovascular Exercise Tolerance: Good (-) angina(-) Past MI negative cardio ROS       Neuro/Psych negative neurological ROS  negative psych ROS   GI/Hepatic negative GI ROS, Neg liver ROS, neg GERD  ,  Endo/Other  neg diabetesHypothyroidism   Renal/GU negative Renal ROS     Musculoskeletal  (+) Arthritis ,   Abdominal   Peds  Hematology negative hematology ROS (+)   Anesthesia Other Findings Past Medical History: No date: Allergic rhinitis No date: Arthritis     Comment:  right knee No date: Complication of anesthesia No date: Diverticulosis of colon No date: Hypothyroidism No date: Malignant hyperthermia  Past Surgical History: No date: ANTERIOR CRUCIATE LIGAMENT REPAIR     Comment:  Left 1980: BACK SURGERY     Comment:  lumbar disk 1984: KNEE ARTHROSCOPY     Comment:  Right 2001: LASIK 2010: NASAL SINUS SURGERY     Comment:  left side, chronic septal perforation  BMI    Body Mass Index: 25.84 kg/m      Reproductive/Obstetrics negative OB ROS                             Anesthesia Physical  Anesthesia Plan  ASA: III  Anesthesia Plan: General   Post-op Pain Management:    Induction: Intravenous  PONV Risk Score and Plan: 2 and Propofol infusion and TIVA  Airway Management Planned: LMA and Oral ETT  Additional Equipment:   Intra-op Plan:   Post-operative Plan: Extubation in OR  Informed  Consent: I have reviewed the patients History and Physical, chart, labs and discussed the procedure including the risks, benefits and alternatives for the proposed anesthesia with the patient or authorized representative who has indicated his/her understanding and acceptance.     Dental Advisory Given  Plan Discussed with: Anesthesiologist, CRNA and Surgeon  Anesthesia Plan Comments: (Patient with MH history.  Plan for non trigger anesthetic   Patient reports no bleeding problems and no anticoagulant use.  Plan for GA with propofol  Patient consented for risks of anesthesia including but not limited to:  - adverse reactions to medications - risk of bleeding, infection, nerve damage and headache - risk of failed spinal - damage to teeth, lips or other oral mucosa - sore throat or hoarseness - Damage to heart, brain, lungs or loss of life  Patient voiced understanding.)        Anesthesia Quick Evaluation

## 2020-04-12 NOTE — Anesthesia Postprocedure Evaluation (Signed)
Anesthesia Post Note  Patient: Shawn Phillips  Procedure(s) Performed: BALLOON DILATION (N/A ) CYSTOSCOPY WITH URETHRAL DILATATION (N/A Urethra)  Patient location during evaluation: PACU Anesthesia Type: General Level of consciousness: awake and alert Pain management: pain level controlled Vital Signs Assessment: post-procedure vital signs reviewed and stable Respiratory status: spontaneous breathing, nonlabored ventilation, respiratory function stable and patient connected to nasal cannula oxygen Cardiovascular status: blood pressure returned to baseline and stable Postop Assessment: no apparent nausea or vomiting Anesthetic complications: no   No complications documented.   Last Vitals:  Vitals:   04/12/20 1018 04/12/20 1025  BP:  133/70  Pulse: (!) 49 (!) 48  Resp: 16 16  Temp: (!) 36.1 C (!) 35.9 C  SpO2: 99% 99%    Last Pain:  Vitals:   04/12/20 1025  TempSrc: Temporal  PainSc: 0-No pain                 Martha Clan

## 2020-04-12 NOTE — Interval H&P Note (Signed)
History and Physical Interval Note:  04/12/2020 8:41 AM  Shawn Phillips  has presented today for surgery, with the diagnosis of penile urethral stricture.  The various methods of treatment have been discussed with the patient and family. After consideration of risks, benefits and other options for treatment, the patient has consented to  Procedure(s): BALLOON DILATION (N/A) [place] (N/A) as a surgical intervention.  The patient's history has been reviewed, patient examined, no change in status, stable for surgery.  I have reviewed the patient's chart and labs.  Questions were answered to the patient's satisfaction.     [place]

## 2020-04-13 ENCOUNTER — Encounter: Payer: Self-pay | Admitting: Urology

## 2020-04-14 ENCOUNTER — Ambulatory Visit (INDEPENDENT_AMBULATORY_CARE_PROVIDER_SITE_OTHER): Payer: BC Managed Care – PPO | Admitting: Physician Assistant

## 2020-04-14 ENCOUNTER — Encounter: Payer: Self-pay | Admitting: Physician Assistant

## 2020-04-14 ENCOUNTER — Other Ambulatory Visit: Payer: Self-pay

## 2020-04-14 VITALS — BP 153/76 | HR 52 | Ht 66.0 in | Wt 165.0 lb

## 2020-04-14 DIAGNOSIS — N35919 Unspecified urethral stricture, male, unspecified site: Secondary | ICD-10-CM | POA: Diagnosis not present

## 2020-04-14 NOTE — Patient Instructions (Signed)
Insert a catheter approximately 3 inches into your urethra to keep your stricture open. I have provided a sample schedule below, but if things are going well and you wish to do this more often, that is okay. -The next two weeks: once daily -The two months after that: three times weekly -The two months after that: once weekly  These are one-time-use catheters; throw them away after using them. Always wash your hands before inserting the catheter. You should receive a shipment of these catheters to your home by the end of the week.  If you start to have more difficulty passing the catheters or meet resistance with doing so, call us as this is a sign that your stricture is coming back.

## 2020-04-14 NOTE — Progress Notes (Signed)
Catheter Removal  Patient is present today for a catheter removal.  37ml of water was drained from the balloon. A 20FR foley cath was removed from the bladder no complications were noted . Patient tolerated well.  Performed [id number] Harpreet Pompey CMA   Additional notes: Counseled patient that urethral strictures are likely to recur. Instructed him to begin urethral sounding at least once daily x2 weeks then 3 times weekly x2 months. Demonstrated in clinic today, provided catheter samples, [employer] order placed today. Patient expressed understanding. Debroah Loop, PA-C   Follow up: Return in about 2 months (around 06/15/2020) for Postop f/u with Dr. Bernardo Heater.

## 2020-04-25 ENCOUNTER — Encounter: Payer: Self-pay | Admitting: Urology

## 2020-06-19 ENCOUNTER — Encounter: Payer: Self-pay | Admitting: Urology

## 2020-06-20 ENCOUNTER — Ambulatory Visit: Payer: Self-pay | Admitting: Urology

## 2020-07-13 ENCOUNTER — Ambulatory Visit (INDEPENDENT_AMBULATORY_CARE_PROVIDER_SITE_OTHER): Payer: BC Managed Care – PPO | Admitting: Urology

## 2020-07-13 ENCOUNTER — Encounter: Payer: Self-pay | Admitting: Urology

## 2020-07-13 ENCOUNTER — Other Ambulatory Visit: Payer: Self-pay

## 2020-07-13 VITALS — BP 184/78 | HR 53 | Ht 66.0 in | Wt 160.0 lb

## 2020-07-13 DIAGNOSIS — N35914 Unspecified anterior urethral stricture, male: Secondary | ICD-10-CM | POA: Diagnosis not present

## 2020-07-13 NOTE — Progress Notes (Signed)
07/13/2020 1:08 PM   Shawn Phillips [birth year] [id number]  Referring provider: Dion Body, MD [facility] [place],  [place] [address]  Chief Complaint  Patient presents with  . Follow-up    HPI: 64 y.o. male presents for postop follow-up.   Initially seen 03/24/2020 with bothersome LUTS and PVR 67  Was interested in [place] however found to have a penile urethral stricture on cystoscopy  Status post balloon dilation of anterior urethral stricture 04/12/2020  Catheter removed 2 days postop and he was started on self-catheterization which he has been doing daily since the catheter was removed without problems  Voiding with an excellent stream and feels he is emptying his bladder   PMH: Past Medical History:  Diagnosis Date  . Allergic rhinitis   . Arthritis    right knee  . Complication of anesthesia   . Diverticulosis of colon   . Hypothyroidism   . Malignant hyperthermia     Surgical History: Past Surgical History:  Procedure Laterality Date  . ANTERIOR CRUCIATE LIGAMENT REPAIR  1994   Left  . BACK SURGERY  1980   lumbar disk  . BALLOON DILATION N/A 04/12/2020   Procedure: BALLOON DILATION;  Surgeon: Abbie Sons, MD;  Location: [facility] ORS;  Service: Urology;  Laterality: N/A;  . CYSTOSCOPY WITH URETHRAL DILATATION N/A 04/12/2020   Procedure: CYSTOSCOPY WITH URETHRAL DILATATION;  Surgeon: Abbie Sons, MD;  Location: [facility] ORS;  Service: Urology;  Laterality: N/A;  . KNEE ARTHROPLASTY Right 10/21/2019   Procedure: RIGHT COMPUTER ASSISTED TOTAL KNEE ARTHROPLASTY;  Surgeon: Dereck Leep, MD;  Location: [facility] ORS;  Service: Orthopedics;  Laterality: Right;  . KNEE ARTHROSCOPY  1984   Right  . LASIK  2001  . NASAL SINUS SURGERY  2010   left side, chronic septal perforation    Home Medications:  Allergies as of 07/13/2020      Reactions   Other Other (See Comments)   Anesthesia gases---malignant hyperthermia        Medication List       Accurate as of July 13, 2020  1:08 PM. If you have any questions, ask your nurse or doctor.        STOP taking these medications   HYDROcodone-acetaminophen 5-325 MG tablet Commonly known as: NORCO/VICODIN Stopped by: Abbie Sons, MD     TAKE these medications   celecoxib 200 MG capsule Commonly known as: CeleBREX Take 1 capsule (200 mg total) by mouth 2 (two) times daily.   docusate sodium 100 MG capsule Commonly known as: COLACE Take 200 mg by mouth 2 (two) times daily as needed (CONSTIPATION.).   fluticasone 50 MCG/ACT nasal spray Commonly known as: FLONASE Place 2 sprays into the nose daily. What changed:   how much to take  when to take this   loratadine 10 MG tablet Commonly known as: CLARITIN Take 10-20 mg by mouth daily as needed for allergies.   MULTIVITAMIN ADULTS PO Take 1 packet by mouth daily.   Proctosol HC 2.5 % rectal cream Generic drug: hydrocortisone Place 1 application rectally 2 (two) times daily as needed for hemorrhoids.   Synthroid 125 MCG tablet Generic drug: levothyroxine TAKE 1 TABLET BY MOUTH EVERY DAY**NEEDS OFFICE VISIT** What changed: See the new instructions.       Allergies:  Allergies  Allergen Reactions  . Other Other (See Comments)    Anesthesia gases---malignant hyperthermia    Family History: Family History  Problem Relation Age  of Onset  . Hypertension Mother   . Diabetes Mother   . Heart disease Mother   . Healthy Father   . Diabetes Maternal Aunt   . COPD Paternal Uncle   . Coronary artery disease Paternal Uncle   . Cancer Other        Both sides in smokers    Social History:  reports that he has never smoked. He has never used smokeless tobacco. He reports current alcohol use. He reports that he does not use drugs.   Physical Exam: BP (!) 184/78   Pulse (!) 53   Ht 5\' 6"  (1.676 m)   Wt 160 lb (72.6 kg)   BMI 25.82 kg/m   Constitutional:  Alert and oriented, No acute  distress. HEENT: [place] AT, moist mucus membranes.  Trachea midline, no masses. Cardiovascular: No clubbing, cyanosis, or edema. Respiratory: Normal respiratory effort, no increased work of breathing.    Assessment & Plan:    1.  Anterior urethral stricture  Doing well status post balloon dilation  He would like to continue self-catheterization and we will have him decrease to every other day then 3 times a week with a goal of catheterizing once every 1 to 2 weeks  Also discussed referral to a reconstructive urologist for definitive repair however he desires to continue self-catheterization  Follow-up 6 months   Abbie Sons, MD  [place] [address], [place], [place] [address] [phone]

## 2020-11-30 ENCOUNTER — Encounter: Payer: Self-pay | Admitting: Dermatology

## 2020-11-30 ENCOUNTER — Ambulatory Visit (INDEPENDENT_AMBULATORY_CARE_PROVIDER_SITE_OTHER): Payer: Medicare Other | Admitting: Dermatology

## 2020-11-30 ENCOUNTER — Other Ambulatory Visit: Payer: Self-pay

## 2020-11-30 DIAGNOSIS — L820 Inflamed seborrheic keratosis: Secondary | ICD-10-CM

## 2020-11-30 DIAGNOSIS — L821 Other seborrheic keratosis: Secondary | ICD-10-CM | POA: Diagnosis not present

## 2020-11-30 DIAGNOSIS — L814 Other melanin hyperpigmentation: Secondary | ICD-10-CM | POA: Diagnosis not present

## 2020-11-30 DIAGNOSIS — Z1283 Encounter for screening for malignant neoplasm of skin: Secondary | ICD-10-CM

## 2020-11-30 DIAGNOSIS — L570 Actinic keratosis: Secondary | ICD-10-CM | POA: Diagnosis not present

## 2020-11-30 DIAGNOSIS — D229 Melanocytic nevi, unspecified: Secondary | ICD-10-CM

## 2020-11-30 DIAGNOSIS — D485 Neoplasm of uncertain behavior of skin: Secondary | ICD-10-CM | POA: Diagnosis not present

## 2020-11-30 DIAGNOSIS — L578 Other skin changes due to chronic exposure to nonionizing radiation: Secondary | ICD-10-CM

## 2020-11-30 DIAGNOSIS — D489 Neoplasm of uncertain behavior, unspecified: Secondary | ICD-10-CM

## 2020-11-30 DIAGNOSIS — D2372 Other benign neoplasm of skin of left lower limb, including hip: Secondary | ICD-10-CM

## 2020-11-30 DIAGNOSIS — D1800 Hemangioma unspecified site: Secondary | ICD-10-CM

## 2020-11-30 NOTE — Patient Instructions (Signed)

## 2020-11-30 NOTE — Progress Notes (Signed)
Follow-Up Visit   Subjective  Shawn Phillips is a 65 y.o. male who presents for the following: Annual Exam (Mole check, Hx of Dysplastic nevus). Pt c/o irritated itching  growths on the right calf and forehead hairline  The patient presents for Total-Body Skin Exam (TBSE) for skin cancer screening and mole check.  The following portions of the chart were reviewed this encounter and updated as appropriate:   Tobacco  Allergies  Meds  Problems  Med Hx  Surg Hx  Fam Hx     Review of Systems:  No other skin or systemic complaints except as noted in HPI or Assessment and Plan.  Objective  Well appearing patient in no apparent distress; mood and affect are within normal limits.  A full examination was performed including scalp, head, eyes, ears, nose, lips, neck, chest, axillae, abdomen, back, buttocks, bilateral upper extremities, bilateral lower extremities, hands, feet, fingers, toes, fingernails, and toenails. All findings within normal limits unless otherwise noted below.  Objective  Scalp (4): Erythematous thin papules/macules with gritty scale.   Objective  Right Lateral distal calf, forehead hairline (2): Erythematous keratotic or waxy stuck-on papule or plaque.   Objective  Right axilla: ~0.5cm Tan-brown and/or pink-flesh-colored symmetric macule    Assessment & Plan  AK (actinic keratosis) (4) Scalp  Destruction of lesion - Scalp Complexity: simple   Destruction method: cryotherapy   Informed consent: discussed and consent obtained   Timeout:  patient name, date of birth, surgical site, and procedure verified Lesion destroyed using liquid nitrogen: Yes   Region frozen until ice ball extended beyond lesion: Yes   Outcome: patient tolerated procedure well with no complications   Post-procedure details: wound care instructions given    Inflamed seborrheic keratosis (2) Right Lateral distal calf, forehead hairline  R lateral distal calf ISK treated with LN2   Start sample of Pandel cream on R lateral distal calf for irritation   Destruction of lesion - Right Lateral distal calf, forehead hairline Complexity: simple   Destruction method: cryotherapy   Informed consent: discussed and consent obtained   Timeout:  patient name, date of birth, surgical site, and procedure verified Lesion destroyed using liquid nitrogen: Yes   Region frozen until ice ball extended beyond lesion: Yes   Outcome: patient tolerated procedure well with no complications   Post-procedure details: wound care instructions given    Neoplasm of uncertain behavior Right axilla  Epidermal / dermal shaving  Lesion diameter (cm):  0.5 Informed consent: discussed and consent obtained   Timeout: patient name, date of birth, surgical site, and procedure verified   Procedure prep:  Patient was prepped and draped in usual sterile fashion Prep type:  Isopropyl alcohol Anesthesia: the lesion was anesthetized in a standard fashion   Anesthetic:  1% lidocaine w/ epinephrine 1-100,000 buffered w/ 8.4% NaHCO3 Hemostasis achieved with: pressure, aluminum chloride and electrodesiccation   Outcome: patient tolerated procedure well   Post-procedure details: sterile dressing applied and wound care instructions given   Dressing type: bandage and petrolatum    Specimen 1 - Surgical pathology Differential Diagnosis: R/O Irritated Nevus   Check Margins: No ~0.5cm Tan-brown and/or pink-flesh-colored symmetric macule  Pt report nevus in the R axilla area greater than 10 years  Skin cancer screening   Lentigines - Scattered tan macules - Due to sun exposure - Benign-appering, observe - Recommend daily broad spectrum sunscreen SPF 30+ to sun-exposed areas, reapply every 2 hours as needed. - Call for any changes  Seborrheic Keratoses - Stuck-on, waxy, tan-brown papules and plaques  - Discussed benign etiology and prognosis. - Observe - Call for any changes  Melanocytic Nevi -  Tan-brown and/or pink-flesh-colored symmetric macules and papules - Benign appearing on exam today - Observation - Call clinic for new or changing moles - Recommend daily use of broad spectrum spf 30+ sunscreen to sun-exposed areas.   Hemangiomas - Red papules - Discussed benign nature - Observe - Call for any changes  Actinic Damage - Chronic, secondary to cumulative UV/sun exposure - diffuse scaly erythematous macules with underlying dyspigmentation - Recommend daily broad spectrum sunscreen SPF 30+ to sun-exposed areas, reapply every 2 hours as needed.  - Call for new or changing lesions.  Dermatofibroma Left ankle - Firm pink/brown papulenodule with dimple sign - Benign appearing - Call for any changes  Skin cancer screening performed today.  Return in about 1 year (around 11/30/2021) for tbse.  IMarye Round, CMA, am acting as scribe for Sarina Ser, MD .  Documentation: I have reviewed the above documentation for accuracy and completeness, and I agree with the above.  Sarina Ser, MD

## 2020-12-02 ENCOUNTER — Telehealth: Payer: Self-pay

## 2020-12-02 NOTE — Telephone Encounter (Signed)
-----   Message from Ralene Bathe, MD sent at 12/01/2020  7:00 PM EST ----- Diagnosis Skin , right axilla PIGMENTED SEBORRHEIC KERATOSIS  Benign keratosis No further treatment needed

## 2020-12-02 NOTE — Telephone Encounter (Signed)
Advised patient of results/hd  

## 2020-12-23 ENCOUNTER — Ambulatory Visit (INDEPENDENT_AMBULATORY_CARE_PROVIDER_SITE_OTHER): Payer: Medicare Other | Admitting: Family Medicine

## 2020-12-23 ENCOUNTER — Other Ambulatory Visit: Payer: Self-pay

## 2020-12-23 VITALS — BP 140/70 | Ht 67.0 in | Wt 160.0 lb

## 2020-12-23 DIAGNOSIS — M7918 Myalgia, other site: Secondary | ICD-10-CM

## 2020-12-23 DIAGNOSIS — M5432 Sciatica, left side: Secondary | ICD-10-CM | POA: Diagnosis not present

## 2020-12-23 DIAGNOSIS — Z96651 Presence of right artificial knee joint: Secondary | ICD-10-CM | POA: Diagnosis not present

## 2020-12-23 DIAGNOSIS — M5430 Sciatica, unspecified side: Secondary | ICD-10-CM | POA: Insufficient documentation

## 2020-12-23 DIAGNOSIS — R269 Unspecified abnormalities of gait and mobility: Secondary | ICD-10-CM | POA: Diagnosis not present

## 2020-12-23 NOTE — Patient Instructions (Signed)
It was great to meet you today! Thank you for letting me participate in your care!  Today, we discussed your left sided hip and buttock pain which I believe is due to sciatica. I am giving you a prescription for physical therapy which you can take to any PT place of your choice. You can continue taking Celebrex as before. I will see you back in 4 weeks to ensure you are progressing well. If your pain gets worse you can always call in and I will give you a prednisone dose pack.  Be well, Harolyn Rutherford, DO PGY-4, Sports Medicine Fellow [place]

## 2020-12-23 NOTE — Progress Notes (Signed)
    SUBJECTIVE:   CHIEF COMPLAINT / HPI:   Left Hip Pain Shawn [place] is a very pleasant 65 y/o male who presents today for left hip pain that started a couple of months ago. No trauma or injury. He states the pain is somewhat dull but with tingling, starts in the buttock region and radiates down the back of the leg stopping at the knee. He states he has not had pain like this before but has had a ruptured disc and this feels different. The pain is not constant and is associated with long periods of sitting or standing in place. Stretching seems to help. Celebrex also seems to help. He has also recently been using a heel lift in his left shoe and thinks that has also helped. Today, his pain is minimal.  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 Hip, Left: No obvious rash, erythema, ecchymosis, or edema. TTP in the piriformis region. ROM full in all directions; Strength 5/5 in IR/ER/Flex/Ext/Abd/Add. Pelvic alignment unremarkable to inspection and palpation. Standing hip rotation and gait without unsteadiness. Greater trochanter without tenderness to palpation. No SI joint tenderness and normal minimal SI movement. Special Test:   - FABER/FADIR test: NEG   - Passive Log Roll test: NEG     ASSESSMENT/PLAN:   Sciatica History, exam, and presentation consistent with a sciatia/piriformis syndrome type picture - Cont Celebrex - Formal physical therapy - F/u in 4 weeks - I did let patient know if pain gets worse he can call in and I would be willing to send in a prednisone dose pack     Nuala Alpha, DO PGY-4, Sports Medicine Fellow [place]

## 2020-12-23 NOTE — Progress Notes (Signed)
[facility]: Attending Note: I have reviewed the chart, discussed wit the Sports Medicine Fellow. I agree with assessment and treatment plan as detailed in the [place] note. Left hip and buttock pain Consistent with buttock muscle chronic strain/dysfunction.  He had total knee replacement on the right.  Since then his gait has been altered.  He does have a small leg length discrepancy.  We will also add a small heel lift to see if that improves however when I examined his gait he has extreme Jan you varus on the left and the knee that has been replaced he is of course much straighter.  I think this mechanically is causing some pelvic girdle dysfunction.  I think he would benefit from formal physical therapy we will set that up.  If is not improving, he will follow up.  If he has significant resolution, he does not need to follow-up.

## 2020-12-23 NOTE — Assessment & Plan Note (Addendum)
History, exam, and presentation consistent with a sciatia/piriformis syndrome type picture - Cont Celebrex - Formal physical therapy - F/u in 4 weeks - I did let patient know if pain gets worse he can call in and I would be willing to send in a prednisone dose pack

## 2020-12-27 ENCOUNTER — Telehealth: Payer: Self-pay | Admitting: *Deleted

## 2020-12-27 MED ORDER — PREDNISONE 10 MG (21) PO TBPK
ORAL_TABLET | ORAL | 0 refills | Status: DC
Start: 2020-12-27 — End: 2021-07-31

## 2020-12-27 NOTE — Addendum Note (Signed)
Addended by: Cyd Silence on: 12/27/2020 04:10 PM   Modules accepted: Orders

## 2020-12-27 NOTE — Telephone Encounter (Signed)
Prescription called in to pharmacy

## 2021-01-12 ENCOUNTER — Ambulatory Visit: Payer: Self-pay | Admitting: Urology

## 2021-01-13 ENCOUNTER — Ambulatory Visit: Payer: Self-pay | Admitting: Urology

## 2021-01-20 ENCOUNTER — Other Ambulatory Visit: Payer: Self-pay | Admitting: Student

## 2021-01-20 DIAGNOSIS — M5416 Radiculopathy, lumbar region: Secondary | ICD-10-CM

## 2021-01-29 NOTE — Progress Notes (Signed)
01/29/2021  10:31 PM   Shawn [place] 1956/01/22 [id number]  Referring provider: Dion Body, MD [facility] [place],  [place] [address] No chief complaint on file.  Urologic history: 1.  Penile urethral stricture  Obstructive voiding symptoms June 2021 with mild elevated residual  Found to have penile urethral stricture and underwent balloon dilation 04/12/2020 with resolution of symptoms   HPI: Shawn [place] is a 64 y.o. male presents for a 39-month follow-up.   Catheterizing anterior urethra every 3-4 days; has slight resistance but overall no difficulty  No bothersome LUTS and states he is doing well   PMH: Past Medical History:  Diagnosis Date  . Allergic rhinitis   . Arthritis    right knee  . Complication of anesthesia   . Diverticulosis of colon   . Dysplastic nevus 11/08/2014   L sup med scapula - mild  . Hypothyroidism   . Malignant hyperthermia     Surgical History: Past Surgical History:  Procedure Laterality Date  . ANTERIOR CRUCIATE LIGAMENT REPAIR  1994   Left  . BACK SURGERY  1980   lumbar disk  . BALLOON DILATION N/A 04/12/2020   Procedure: BALLOON DILATION;  Surgeon: Abbie Sons, MD;  Location: [facility] ORS;  Service: Urology;  Laterality: N/A;  . CYSTOSCOPY WITH URETHRAL DILATATION N/A 04/12/2020   Procedure: CYSTOSCOPY WITH URETHRAL DILATATION;  Surgeon: Abbie Sons, MD;  Location: [facility] ORS;  Service: Urology;  Laterality: N/A;  . KNEE ARTHROPLASTY Right 10/21/2019   Procedure: RIGHT COMPUTER ASSISTED TOTAL KNEE ARTHROPLASTY;  Surgeon: Dereck Leep, MD;  Location: [facility] ORS;  Service: Orthopedics;  Laterality: Right;  . KNEE ARTHROSCOPY  1984   Right  . LASIK  2001  . NASAL SINUS SURGERY  2010   left side, chronic septal perforation    Home Medications:  Allergies as of 01/30/2021      Reactions   Other Other (See Comments)   Anesthesia gases---malignant hyperthermia      Medication List        Accurate as of Jan 29, 2021 10:31 PM. If you have any questions, ask your nurse or doctor.        docusate sodium 100 MG capsule Commonly known as: COLACE Take 200 mg by mouth 2 (two) times daily as needed (CONSTIPATION.).   fluticasone 50 MCG/ACT nasal spray Commonly known as: FLONASE Place 2 sprays into the nose daily. What changed:   how much to take  when to take this   loratadine 10 MG tablet Commonly known as: CLARITIN Take 10-20 mg by mouth daily as needed for allergies.   MULTIVITAMIN ADULTS PO Take 1 packet by mouth daily.   predniSONE 10 MG (21) Tbpk tablet Commonly known as: STERAPRED UNI-PAK 21 TAB 6-day dose pak   Proctosol HC 2.5 % rectal cream Generic drug: hydrocortisone Place 1 application rectally 2 (two) times daily as needed for hemorrhoids.   Synthroid 125 MCG tablet Generic drug: levothyroxine TAKE 1 TABLET BY MOUTH EVERY DAY**NEEDS OFFICE VISIT** What changed: See the new instructions.       Allergies: Allergies  Allergen Reactions  . Other Other (See Comments)    Anesthesia gases---malignant hyperthermia    Family History: Family History  Problem Relation Age of Onset  . Hypertension Mother   . Diabetes Mother   . Heart disease Mother   . Healthy Father   . Diabetes Maternal Aunt   . COPD Paternal Uncle   .  Coronary artery disease Paternal Uncle   . Cancer Other        Both sides in smokers    Social History:   reports that he has never smoked. He has never used smokeless tobacco. He reports current alcohol use. He reports that he does not use drugs.  ROS: Pertinent ROS in HPI.  Physical Exam: There were no vitals taken for this visit.  Constitutional:  Alert and oriented, No acute distress. HEENT: [place] AT, moist mucus membranes.  Trachea midline, no masses. Cardiovascular: No clubbing, cyanosis, or edema. Respiratory: Normal respiratory effort, no increased work of breathing. Skin: No rashes, bruises or suspicious  lesions. Neurologic: Grossly intact, no focal deficits, moving all 4 extremities. Psychiatric: Normal mood and affect.   Assessment & Plan:    1.  Anterior urethral stricture  Doing well with every 3-4-day catheterization of the anterior urethra  Recommend he try and increase this interval with a goal of catheterizing weekly over the next 6 months  Follow-up 6 months and instructed to call earlier for any problems   [facility] [address] [place], [place] [address] [phone]

## 2021-01-30 ENCOUNTER — Ambulatory Visit (INDEPENDENT_AMBULATORY_CARE_PROVIDER_SITE_OTHER): Payer: Medicare Other | Admitting: Urology

## 2021-01-30 ENCOUNTER — Encounter: Payer: Self-pay | Admitting: Urology

## 2021-01-30 ENCOUNTER — Other Ambulatory Visit: Payer: Self-pay

## 2021-01-30 VITALS — BP 160/87 | HR 52 | Ht 67.0 in | Wt 164.0 lb

## 2021-01-30 DIAGNOSIS — N35914 Unspecified anterior urethral stricture, male: Secondary | ICD-10-CM

## 2021-02-05 ENCOUNTER — Ambulatory Visit
Admission: RE | Admit: 2021-02-05 | Discharge: 2021-02-05 | Disposition: A | Discharge status: Home / Self Care | Payer: Medicare Other | Source: Ambulatory Visit | Attending: Student | Admitting: Student

## 2021-02-05 ENCOUNTER — Other Ambulatory Visit: Payer: Self-pay

## 2021-02-05 DIAGNOSIS — M5416 Radiculopathy, lumbar region: Secondary | ICD-10-CM

## 2021-02-05 MED ORDER — GADOBENATE DIMEGLUMINE 529 MG/ML IV SOLN
15.0000 mL | Freq: Once | INTRAVENOUS | Status: AC | PRN
  Administered 2021-02-05: 15 mL via INTRAVENOUS

## 2021-04-12 ENCOUNTER — Other Ambulatory Visit: Payer: Self-pay | Admitting: Neurosurgery

## 2021-04-12 DIAGNOSIS — M5126 Other intervertebral disc displacement, lumbar region: Secondary | ICD-10-CM

## 2021-04-28 ENCOUNTER — Other Ambulatory Visit: Payer: Self-pay

## 2021-04-28 ENCOUNTER — Ambulatory Visit
Admission: RE | Admit: 2021-04-28 | Discharge: 2021-04-28 | Disposition: A | Discharge status: Home / Self Care | Payer: Medicare Other | Source: Ambulatory Visit | Attending: Neurosurgery | Admitting: Neurosurgery

## 2021-04-28 DIAGNOSIS — M5126 Other intervertebral disc displacement, lumbar region: Secondary | ICD-10-CM

## 2021-04-28 MED ORDER — GADOBENATE DIMEGLUMINE 529 MG/ML IV SOLN
15.0000 mL | Freq: Once | INTRAVENOUS | Status: AC | PRN
  Administered 2021-04-28: 15 mL via INTRAVENOUS

## 2021-08-01 ENCOUNTER — Other Ambulatory Visit
Admission: RE | Admit: 2021-08-01 | Discharge: 2021-08-01 | Disposition: A | Discharge status: Home / Self Care | Payer: Medicare Other | Source: Ambulatory Visit | Attending: Orthopedic Surgery | Admitting: Orthopedic Surgery

## 2021-08-01 ENCOUNTER — Encounter: Payer: Self-pay | Admitting: Orthopedic Surgery

## 2021-08-01 ENCOUNTER — Other Ambulatory Visit: Payer: Self-pay

## 2021-08-01 DIAGNOSIS — K5730 Diverticulosis of large intestine without perforation or abscess without bleeding: Secondary | ICD-10-CM | POA: Diagnosis not present

## 2021-08-01 DIAGNOSIS — M1712 Unilateral primary osteoarthritis, left knee: Secondary | ICD-10-CM | POA: Insufficient documentation

## 2021-08-01 DIAGNOSIS — Z01818 Encounter for other preprocedural examination: Secondary | ICD-10-CM | POA: Diagnosis present

## 2021-08-01 DIAGNOSIS — Z01810 Encounter for preprocedural cardiovascular examination: Secondary | ICD-10-CM | POA: Diagnosis not present

## 2021-08-01 LAB — SURGICAL PCR SCREEN
MRSA, PCR: NEGATIVE
Staphylococcus aureus: POSITIVE — AB

## 2021-08-01 LAB — SEDIMENTATION RATE: Sed Rate: 4 mm/hr (ref 0–20)

## 2021-08-01 LAB — URINALYSIS, ROUTINE W REFLEX MICROSCOPIC
Bilirubin Urine: NEGATIVE
Glucose, UA: NEGATIVE mg/dL
Hgb urine dipstick: NEGATIVE
Ketones, ur: NEGATIVE mg/dL
Nitrite: NEGATIVE
Protein, ur: NEGATIVE mg/dL
Specific Gravity, Urine: 1.018 (ref 1.005–1.030)
Squamous Epithelial / HPF: NONE SEEN (ref 0–5)
pH: 6 (ref 5.0–8.0)

## 2021-08-01 LAB — PROTIME-INR
INR: 1.1 (ref 0.8–1.2)
Prothrombin Time: 14.1 seconds (ref 11.4–15.2)

## 2021-08-01 LAB — TYPE AND SCREEN
ABO/RH(D): O POS
Antibody Screen: NEGATIVE

## 2021-08-01 LAB — C-REACTIVE PROTEIN: CRP: 0.8 mg/dL (ref <1.0)

## 2021-08-01 NOTE — Progress Notes (Signed)
  Perioperative Services Pre-Admission/Anesthesia Testing    Date: 08/01/21  Name: Shawn [place] MRN:   [id number]  Re: MALIGNANT HYPERTHERMIA  Planned Surgical Procedure(s):    Case: [id number] Date/Time: 08/08/21 0715   Procedure: COMPUTER ASSISTED TOTAL KNEE ARTHROPLASTY - RNFA (Left: Knee) - PATIENT MUST BE FIRST CASE!   Anesthesia type: Choice   Pre-op diagnosis: PRIMARY OSTEOARTHRITTIS OF LEFT KNEE.   Location: [facility] OR ROOM 02 / [place] ORS FOR ANESTHESIA GROUP   Surgeons: Dereck Leep, MD   Clinical Notes:  Patient with a PMH of (+) MALIGNANT HYPERTHEMIA. I have ensure that this diagnosis is listed in the patient's EMR under "complications of anesthesia". In efforts to ensure safety with patient's procedural/anesthetic course, I spoke with [name], RN on 08/01/2021 to confirm that patient is [name] first case on 08/08/2021; case scheduling confirmed.    Honor Loh, MSN, APRN, FNP-C, CEN [facility]  Peri-operative Services Nurse Practitioner Phone: [phone] 08/01/21 2:14 PM  NOTE: This note has been prepared using Dragon dictation software. Despite my best ability to proofread, there is always the potential that unintentional transcriptional errors may still occur from this process.

## 2021-08-01 NOTE — Patient Instructions (Addendum)
Your procedure is scheduled on: Tues 11/8 Report to registration desk in the medical mall then to Day Surgery. To find out your arrival time please call [phone] between 1PM - 3PM on Mon 11/7.  Remember: Instructions that are not followed completely may result in serious medical risk,  up to and including death, or upon the discretion of your surgeon and anesthesiologist your  surgery may need to be rescheduled.     _X__ 1. Do not eat food after midnight the night before your procedure.                 No chewing gum or hard candies. You may drink clear liquids up to 2 hours                 before you are scheduled to arrive for your surgery- DO not drink clear                 liquids within 2 hours of the start of your surgery.                 Clear Liquids include:  water, apple juice without pulp, clear Gatorade, G2 or                  Gatorade Zero (avoid Red/Purple/Blue), Black Coffee or Tea (Do not add                 anything to coffee or tea). __x___2.   Complete the "Ensure Clear Pre-surgery Clear Carbohydrate Drink"    provided to you, 2 hours before arrival. **If you are diabetic you will be  provided with an alternative drink, Gatorade Zero or G2.  __X__2.  On the morning of surgery brush your teeth with toothpaste and water, you                may rinse your mouth with mouthwash if you wish.  Do not swallow any   toothpaste of mouthwash.     _X__ 3.  No Alcohol for 24 hours before or after surgery.   ___ 4.  Do Not Smoke or use e-cigarettes For 24 Hours Prior to Your Surgery.                 Do not use any chewable tobacco products for at least 6 hours prior to                 Surgery.  ___  5.  Do not use any recreational drugs (marijuana, cocaine, heroin, ecstasy,    MDMA or other) For at least one week prior to your surgery.       Combination of these drugs with anesthesia may have life threatening    results.  ____  6.  Bring all medications  with you on the day of surgery if instructed.   __x__  7.  Notify your doctor if there is any change in your medical condition      (cold, fever, infections).     Do not wear jewelry,  Do not wear lotions, You may wear deodorant. Do not shave 48 hours prior to surgery. Men may shave face and neck. Do not bring valuables to the hospital.    [facility] is not responsible for any belongings or valuables.  Contacts, dentures or bridgework may not be worn into surgery. Leave your suitcase in the car. After surgery it may be brought to your room. For patients admitted to the hospital,  discharge time is determined by your treatment team.   Patients discharged the day of surgery will not be allowed to drive home.   Make arrangements for someone to be with you for the first 24 hours of your Same Day Discharge.    Please read over the following fact sheets that you were given:   Incentive spirometer    __x__ Take these medicines the morning of surgery with A SIP OF WATER:    1. SYNTHROID 125 MCG tablet  2. fluticasone (FLONASE) 50 MCG/ACT nasal spray  3. loratadine (CLARITIN) 10 MG tablet if needed  4.  5.  6.  ____ Fleet Enema (as directed)   __x__ Use CHG Soap (or wipes) as directed  ____ Use Benzoyl Peroxide Gel as instructed  ____ Use inhalers on the day of surgery  ____ Stop metformin 2 days prior to surgery    ____ Take 1/2 of usual insulin dose the night before surgery. No insulin the morning          of surgery.   ____ Stop Coumadin/Plavix/aspirin on   __x__ Stop Anti-inflammatories No ibuprofen aleve or aspirin products until after surgery   __x__ Stop supplements until after surgery.  Multiple Vitamins-Minerals (MULTIVITAMIN ADULTS PO)  ____ Bring C-Pap to the hospital.    If you have any questions regarding your pre-procedure instructions,  Please call Pre-admit Testing at [phone]

## 2021-08-01 NOTE — Discharge Instructions (Addendum)
Instructions after Total Knee Replacement   Shawn [place]., M.D.     Dept. of [facility]  [place], [place]  [address]  Phone: [phone]   Fax: [phone]    DIET: Drink plenty of non-alcoholic fluids. Resume your normal diet. Include foods high in fiber.  ACTIVITY:  You may use crutches or a walker with weight-bearing as tolerated, unless instructed otherwise. You may be weaned off of the walker or crutches by your Physical Therapist.  Do NOT place pillows under the knee. Anything placed under the knee could limit your ability to straighten the knee.   Continue doing gentle exercises. Exercising will reduce the pain and swelling, increase motion, and prevent muscle weakness.   Please continue to use the TED compression stockings for 6 weeks. You may remove the stockings at night, but should reapply them in the morning. Do not drive or operate any equipment until instructed.  WOUND CARE:  Continue to use the PolarCare or ice packs periodically to reduce pain and swelling. You may bathe or shower after the staples are removed at the first office visit following surgery.  MEDICATIONS: You may resume your regular medications. Please take the pain medication as prescribed on the medication. Do not take pain medication on an empty stomach. You have been given a prescription for a blood thinner (Lovenox or Coumadin). Please take the medication as instructed. (NOTE: After completing a 2 week course of Lovenox, take one Enteric-coated aspirin once a day. This along with elevation will help reduce the possibility of phlebitis in your operated leg.) Do not drive or drink alcoholic beverages when taking pain medications.  CALL THE OFFICE FOR: Temperature above 101 degrees Excessive bleeding or drainage on the dressing. Excessive swelling, coldness, or paleness of the toes. Persistent nausea and vomiting.  FOLLOW-UP:  You  should have an appointment to return to the office in 10-14 days after surgery. Arrangements have been made for continuation of Physical Therapy (either home therapy or outpatient therapy).    [facility] Department Directory         [email or web address]       [email or web address]          Cardiology  Appointments: [place] [phone]  Endocrinology  Appointments: [place] [phone] [place] [phone]  Gastroenterology  Appointments: [place] [phone] [place] [phone]        General Surgery   Appointments: [facility]  Internal Medicine/Family Medicine  Appointments: [facility] - [phone] [place] [phone]  Metabolic and [place] Loss Surgery  Appointments: [facility]        Neurology  Appointments: [place] [phone] [place] - [phone]  Neurosurgery  Appointments: [place]  Obstetrics & Gynecology  Appointments: [place] [phone] [place] - [phone]        Pediatrics  Appointments: [place] [phone] [place] - [phone]  Physiatry  Appointments: [place] [phone]  Physical Therapy  Appointments: [place] [phone]        Podiatry  Appointments: [place] [phone] [place] - [phone]  Pulmonology  Appointments: [place]  Rheumatology  Appointments: [place] [phone]        [place] Location: [facility]  [place], [place]  [address]  [place] Location: [facility] [address] [place], [place]  [address]  [place] Location: [facility] [address] [place], [place]  [address]  AMBULATORY SURGERY  DISCHARGE INSTRUCTIONS   The drugs that you were given will stay in your system until tomorrow so for the next 24 hours you should not:  Drive an  automobile Make any legal decisions Drink any alcoholic beverage   You may resume regular meals tomorrow.  Today it is better to start with liquids and gradually work up to solid foods.  You may eat anything you prefer, but it is better to start with liquids, then soup and crackers, and gradually work up to solid foods.   Please notify your doctor immediately if you have any unusual bleeding, trouble breathing, redness and pain at the surgery site, drainage, fever, or pain not relieved by medication.    Additional Instructions:  Please contact your physician with any problems or Same Day Surgery at [phone], Monday through Friday 6 am to 4 pm, or [place] at [facility] number at [phone].

## 2021-08-02 ENCOUNTER — Encounter: Payer: Self-pay | Admitting: Urology

## 2021-08-02 ENCOUNTER — Ambulatory Visit (INDEPENDENT_AMBULATORY_CARE_PROVIDER_SITE_OTHER): Payer: Medicare Other | Admitting: Urology

## 2021-08-02 VITALS — BP 165/90 | HR 61 | Ht 66.0 in | Wt 160.0 lb

## 2021-08-02 DIAGNOSIS — N35914 Unspecified anterior urethral stricture, male: Secondary | ICD-10-CM

## 2021-08-02 NOTE — Progress Notes (Signed)
08/02/2021 9:21 AM   Shawn [place] 1956/04/10 [id number]  Referring provider: Dion Body, MD [place] [facility] [place],  [place] [address]  Chief Complaint  Patient presents with   Follow-up    Urologic history: 1.  Penile urethral stricture Obstructive voiding symptoms June 2021 with mild elevated residual Found to have penile urethral stricture and underwent balloon dilation 04/12/2020 with resolution of symptoms  HPI: 65 y.o. male presents for follow-up visit.  Currently catheterizing the anterior urethra once weekly which has worked well He recently waited 1.5 weeks and did meet resistance and had a small amount of bleeding   PMH: Past Medical History:  Diagnosis Date   Allergic rhinitis    Arthritis    right knee   Complication of anesthesia    a.) Malignant hyperthermia   Diverticulosis of colon    Dysplastic nevus 11/08/2014   L sup med scapula - mild   Hypothyroidism    Malignant hyperthermia     Surgical History: Past Surgical History:  Procedure Laterality Date   ANTERIOR CRUCIATE LIGAMENT REPAIR  1994   Left   BACK SURGERY  1980   lumbar disk   BALLOON DILATION N/A 04/12/2020   Procedure: BALLOON DILATION;  Surgeon: Abbie Sons, MD;  Location: [facility] ORS;  Service: Urology;  Laterality: N/A;   CYSTOSCOPY WITH URETHRAL DILATATION N/A 04/12/2020   Procedure: CYSTOSCOPY WITH URETHRAL DILATATION;  Surgeon: Abbie Sons, MD;  Location: [facility] ORS;  Service: Urology;  Laterality: N/A;   KNEE ARTHROPLASTY Right 10/21/2019   Procedure: RIGHT COMPUTER ASSISTED TOTAL KNEE ARTHROPLASTY;  Surgeon: Dereck Leep, MD;  Location: [facility] ORS;  Service: Orthopedics;  Laterality: Right;   KNEE ARTHROSCOPY  1984   Right   LASIK  2001   NASAL SINUS SURGERY  2010   left side, chronic septal perforation    Home Medications:  Allergies as of 08/02/2021       Reactions   Other Other (See Comments)   Anesthesia gases---malignant  hyperthermia   Succinylcholine Other (See Comments)   malignant hyperthermia        Medication List        Accurate as of August 02, 2021  9:21 AM. If you have any questions, ask your nurse or doctor.          amoxicillin 500 MG capsule Commonly known as: AMOXIL Take 2,000 mg by mouth See admin instructions. Take 4 capsules (2000 mg) by mouth 1 hour prior to dental appointments.   docusate sodium 100 MG capsule Commonly known as: COLACE Take 200 mg by mouth 2 (two) times daily as needed (CONSTIPATION.).   fluticasone 50 MCG/ACT nasal spray Commonly known as: FLONASE Place 2 sprays into the nose daily. What changed: how much to take   loratadine 10 MG tablet Commonly known as: CLARITIN Take 10-20 mg by mouth daily as needed for allergies.   MULTIVITAMIN ADULTS PO Take 1 packet by mouth in the morning. [employer] Vitamin pack   OPTIVE 0.5-0.9 % ophthalmic solution Generic drug: carboxymethylcellul-glycerin Place 1-2 drops into both eyes 2 (two) times daily as needed (dry/irritated eyes.).   Synthroid 125 MCG tablet Generic drug: levothyroxine TAKE 1 TABLET BY MOUTH EVERY DAY**NEEDS OFFICE VISIT** What changed: See the new instructions.        Allergies:  Allergies  Allergen Reactions   Other Other (See Comments)    Anesthesia gases---malignant hyperthermia   Succinylcholine Other (See Comments)    malignant hyperthermia  Family History: Family History  Problem Relation Age of Onset   Hypertension Mother    Diabetes Mother    Heart disease Mother    Healthy Father    Diabetes Maternal Aunt    COPD Paternal Uncle    Coronary artery disease Paternal Uncle    Cancer Other        Both sides in smokers    Social History:  reports that he has never smoked. He has never used smokeless tobacco. He reports current alcohol use. He reports that he does not use drugs.   Physical Exam: BP (!) 165/90   Pulse 61   Ht 5\' 6"  (1.676 m)   Wt 160 lb (72.6 kg)    BMI 25.82 kg/m   Constitutional:  Alert and oriented, No acute distress. HEENT: [place] AT, moist mucus membranes.  Trachea midline, no masses. Cardiovascular: No clubbing, cyanosis, or edema. Psychiatric: Normal mood and affect.   Assessment & Plan:    1.  Anterior urethral stricture Continue once weekly catheterization of the anterior urethra Annual follow-up   Abbie Sons, MD  [facility] [address] [place], [place] [address] [phone]

## 2021-08-03 ENCOUNTER — Inpatient Hospital Stay: Admission: RE | Admit: 2021-08-03 | Payer: Medicare Other | Source: Ambulatory Visit

## 2021-08-04 ENCOUNTER — Other Ambulatory Visit: Payer: Medicare Other

## 2021-08-04 NOTE — H&P (Signed)
ORTHOPAEDIC HISTORY & PHYSICAL Gwenlyn Fudge, [place] - 08/01/2021 11:00 AM EDT Formatting of this note is different from the original. [facility] Chief Complaint:   Chief Complaint  Patient presents with   Knee Pain  H & P LEFT KNEE   History of Present Illness:   Shawn Phillips is a 65 y.o. male that presents to clinic today for his preoperative history and evaluation. Patient presents unaccompanied. The patient is scheduled to undergo a left total knee arthroplasty on 08/08/21 by Dr. Marry Guan. His pain began several years ago. The pain is located along the medial aspect of the knee. He describes his pain as worse with weightbearing. He denies associated numbness or tingling, swelling, locking, or giving way of the knee.  The patient's symptoms have progressed to the point that they decrease his quality of life. The patient has previously undergone conservative treatment including NSAIDS and injections to the knee without adequate control of his symptoms.  Patient reports history of microdiscectomy lumbar surgery but denies any hardware. Denies history of blood clots, no significant cardiac history.   Of note, patient is status post ACL reconstruction of the left knee.  Patient did have some symptoms concerning for infection following his first knee replacement and states that there was discussion of using a separate type of suture in the left knee replacement.   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   Past Surgical History:  Past Surgical History:  Procedure Laterality Date   ACL repair surgery Bilateral   FUNCTIONAL ENDOSCOPIC SINUS SURGERY   L4-5  left-sided laminectomy and micro discectomy 01/2021  Dr Saintclair Halsted   Right total knee arthroplasty using computer-assisted navigation 10/21/2019  Dr Marry Guan   Current Medications:  Current Outpatient Medications  Medication Sig Dispense Refill   amoxicillin (AMOXIL) 500 MG capsule TAKE 4 CAPSULES BY MOUTH ONE HOUR PRIOR TO DENTAL APPT   carboxymethylcellulose-glycerin (REFRESH OPTIVE) 0.5-0.9 % ophthalmic solution Place 1 drop into both eyes 2 (two) times daily as needed   fluticasone (FLONASE) 50 mcg/actuation nasal spray Place 2 sprays into both nostrils once daily as needed   multivitamin tablet Take 1 tablet by mouth as directed. Pt takes 5x/weekly   SYNTHROID 125 mcg tablet Take 1 tablet (125 mcg total) by mouth once daily Take on an empty stomach with a glass of water at least 30-60 minutes before breakfast. 90 tablet 1   No current facility-administered medications for this visit.   Allergies:  Allergies  Allergen Reactions   Other Other (See Comments)  Anesthesia- malignant hypothermia   Succinylcholine Other (See Comments)  malignant hyperthermia   Social History:  Social History   Socioeconomic History   Marital status: Married  Spouse name: Hilda Blades   Number of children: 2   Years of education: 53  Occupational History   Occupation: Full-time -Tree surgeon  Tobacco Use   Smoking status: Never Smoker   Smokeless tobacco: Never Used  Scientific laboratory technician Use: Never used  Substance and Sexual Activity   Alcohol use: Yes  Comment: Occasional 1-2 /month   Drug use: No   Sexual activity: Defer  Partners: Female  Social History Narrative  Marital Status- Married  Lives with  wife and daughter  Employment- Engineer, building services Orthoptist)  Exercise hx- Does bicycle [employer] 2x/weekly, CrossFit occasionally, walks  Religious Affiliation- Christian   Family History:  Family History  Problem Relation Age of Onset   High blood pressure (Hypertension) Mother   Review of  Systems:   A 10+ ROS was performed, reviewed, and the pertinent orthopaedic findings are documented in the HPI.   Physical Examination:   BP (!) 140/90 (BP Location: Left upper arm, Patient Position: Sitting, BP Cuff Size: Adult)  Ht 167.6 cm (5\' 6" )  Wt 72.1 kg (159 lb)  BMI 25.66 kg/m   Patient is a well-developed, well-nourished male in no acute distress. Patient has normal mood and affect. Patient is alert and oriented to person, place, and time.   HEENT: Atraumatic, normocephalic. Pupils equal and reactive to light. Extraocular motion intact. Noninjected sclera.  Cardiovascular: Regular rate and rhythm, with no murmurs, rubs, or gallops. Distal pulses palpable. No bruits.  Respiratory: Lungs clear to auscultation bilaterally.   Left Knee: Well-healed surgical incision noted. Soft tissue swelling: none Effusion: none Erythema: none Crepitance: mild Tenderness: medial Alignment: relative varus Mediolateral laxity: medial pseudolaxity Posterior sag: negative Patellar tracking: Good tracking without evidence of subluxation or tilt Atrophy: No significant atrophy.  Quadriceps tone was good. Range of motion: 0/5/125 degrees  Sensation intact over the saphenous, lateral sural cutaneous, superficial fibular, and deep fibular nerve distributions.  Tests Performed/Reviewed:  X-rays  3 views of the left knee were reviewed. Images reveal severe loss of medial compartment joint space with osteophyte formation. No fractures or dislocations. No other osseous abnormality noted. Retained screws from ACL reconstruction noted.  Impression:   ICD-10-CM  1. Primary osteoarthritis of left knee M17.12  2. 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 S/P ACL reconstruction, left knee Z98.890   Plan:   The patient has end-stage degenerative changes of the left knee. It was explained to the patient that the condition is progressive in nature. Having failed conservative  treatment, the patient has elected to proceed with a total joint arthroplasty. The patient will undergo a total joint arthroplasty with Dr. Marry Guan. The risks of surgery, including blood clot and infection, were discussed with the patient. Measures to reduce these risks, including the use of anticoagulation, perioperative antibiotics, and early ambulation were discussed. The importance of postoperative physical therapy was discussed with the patient. The patient elects to proceed with surgery. The patient is instructed to stop all blood thinners prior to surgery. The patient is instructed to call the hospital the day before surgery to learn of the proper arrival time.   Contact our office with any questions or concerns. Follow up as indicated, or sooner should any new problems arise, if conditions worsen, or if they are otherwise concerned.   Gwenlyn Fudge, [facility] [place], [place] [address] Phone: [phone]  This note was generated in part with voice recognition software and I apologize for any typographical errors that were not detected and corrected.  Electronically signed by Gwenlyn Fudge, PA at 08/01/2021 12:26 PM EDT

## 2021-08-07 ENCOUNTER — Other Ambulatory Visit
Admission: RE | Admit: 2021-08-07 | Discharge: 2021-08-07 | Disposition: A | Discharge status: Home / Self Care | Payer: Medicare Other | Source: Ambulatory Visit | Attending: Orthopedic Surgery | Admitting: Orthopedic Surgery

## 2021-08-07 ENCOUNTER — Other Ambulatory Visit: Payer: Self-pay

## 2021-08-07 DIAGNOSIS — Z01812 Encounter for preprocedural laboratory examination: Secondary | ICD-10-CM | POA: Diagnosis present

## 2021-08-07 DIAGNOSIS — Z20822 Contact with and (suspected) exposure to covid-19: Secondary | ICD-10-CM | POA: Diagnosis not present

## 2021-08-08 ENCOUNTER — Encounter
Admission: RE | Disposition: A | Discharge status: Home / Self Care | Payer: Self-pay | Source: Home / Self Care | Attending: Orthopedic Surgery

## 2021-08-08 ENCOUNTER — Ambulatory Visit
Admission: RE | Admit: 2021-08-08 | Discharge: 2021-08-08 | Disposition: A | Discharge status: Home / Self Care | Payer: Medicare Other | Attending: Orthopedic Surgery | Admitting: Orthopedic Surgery

## 2021-08-08 ENCOUNTER — Other Ambulatory Visit: Payer: Self-pay

## 2021-08-08 ENCOUNTER — Ambulatory Visit: Payer: Medicare Other | Admitting: Urgent Care

## 2021-08-08 ENCOUNTER — Ambulatory Visit: Payer: Medicare Other

## 2021-08-08 ENCOUNTER — Encounter: Payer: Self-pay | Admitting: Orthopedic Surgery

## 2021-08-08 DIAGNOSIS — M1712 Unilateral primary osteoarthritis, left knee: Secondary | ICD-10-CM

## 2021-08-08 DIAGNOSIS — M1732 Unilateral post-traumatic osteoarthritis, left knee: Secondary | ICD-10-CM | POA: Insufficient documentation

## 2021-08-08 DIAGNOSIS — Z96651 Presence of right artificial knee joint: Secondary | ICD-10-CM | POA: Diagnosis not present

## 2021-08-08 DIAGNOSIS — Z9889 Other specified postprocedural states: Secondary | ICD-10-CM | POA: Diagnosis not present

## 2021-08-08 DIAGNOSIS — D649 Anemia, unspecified: Secondary | ICD-10-CM

## 2021-08-08 DIAGNOSIS — R7303 Prediabetes: Secondary | ICD-10-CM

## 2021-08-08 DIAGNOSIS — E039 Hypothyroidism, unspecified: Secondary | ICD-10-CM | POA: Insufficient documentation

## 2021-08-08 DIAGNOSIS — G709 Myoneural disorder, unspecified: Secondary | ICD-10-CM | POA: Insufficient documentation

## 2021-08-08 DIAGNOSIS — M25762 Osteophyte, left knee: Secondary | ICD-10-CM | POA: Insufficient documentation

## 2021-08-08 DIAGNOSIS — J309 Allergic rhinitis, unspecified: Secondary | ICD-10-CM | POA: Diagnosis not present

## 2021-08-08 DIAGNOSIS — Z96659 Presence of unspecified artificial knee joint: Secondary | ICD-10-CM

## 2021-08-08 DIAGNOSIS — K5730 Diverticulosis of large intestine without perforation or abscess without bleeding: Secondary | ICD-10-CM

## 2021-08-08 HISTORY — PX: APPLICATION OF WOUND VAC: SHX5189

## 2021-08-08 HISTORY — PX: KNEE ARTHROPLASTY: SHX992

## 2021-08-08 LAB — SARS CORONAVIRUS 2 (TAT 6-24 HRS): SARS Coronavirus 2: NEGATIVE

## 2021-08-08 SURGERY — ARTHROPLASTY, KNEE, TOTAL, USING IMAGELESS COMPUTER-ASSISTED NAVIGATION
Anesthesia: Spinal | Site: Knee | Laterality: Left

## 2021-08-08 MED ORDER — FERROUS SULFATE 325 (65 FE) MG PO TABS
325.0000 mg | ORAL_TABLET | Freq: Two times a day (BID) | ORAL | Status: DC
  Filled 2021-08-08: qty 1

## 2021-08-08 MED ORDER — PROPOFOL 10 MG/ML IV BOLUS
INTRAVENOUS | Status: AC
  Filled 2021-08-08: qty 20

## 2021-08-08 MED ORDER — LACTATED RINGERS IV BOLUS
250.0000 mL | Freq: Once | INTRAVENOUS | Status: AC
  Administered 2021-08-08: 250 mL via INTRAVENOUS

## 2021-08-08 MED ORDER — HYDROMORPHONE HCL 1 MG/ML IJ SOLN: 0.5000 mg | INTRAMUSCULAR | Status: DC | PRN

## 2021-08-08 MED ORDER — OXYCODONE HCL 5 MG PO TABS: 5.0000 mg | ORAL_TABLET | ORAL | Status: DC | PRN

## 2021-08-08 MED ORDER — BUPIVACAINE HCL (PF) 0.5 % IJ SOLN
INTRAMUSCULAR | Status: DC | PRN
  Administered 2021-08-08: 3 mL via INTRATHECAL

## 2021-08-08 MED ORDER — PHENYLEPHRINE HCL (PRESSORS) 10 MG/ML IV SOLN
INTRAVENOUS | Status: AC
  Filled 2021-08-08: qty 1

## 2021-08-08 MED ORDER — CEFAZOLIN SODIUM-DEXTROSE 2-4 GM/100ML-% IV SOLN
INTRAVENOUS | Status: AC
  Administered 2021-08-08: 2 g via INTRAVENOUS
  Filled 2021-08-08: qty 100

## 2021-08-08 MED ORDER — EPHEDRINE SULFATE 50 MG/ML IJ SOLN
INTRAMUSCULAR | Status: DC | PRN
  Administered 2021-08-08 (×2): 5 mg via INTRAVENOUS

## 2021-08-08 MED ORDER — PANTOPRAZOLE SODIUM 40 MG PO TBEC
40.0000 mg | DELAYED_RELEASE_TABLET | Freq: Two times a day (BID) | ORAL | Status: DC
  Filled 2021-08-08: qty 1

## 2021-08-08 MED ORDER — OXYCODONE HCL 5 MG PO TABS
5.0000 mg | ORAL_TABLET | ORAL | 0 refills | Status: DC | PRN
Start: 2021-08-08 — End: 2023-11-26
  Filled 2021-08-08 (×2): qty 30, 3d supply, fill #0

## 2021-08-08 MED ORDER — CELECOXIB 200 MG PO CAPS: 400.0000 mg | ORAL_CAPSULE | Freq: Once | ORAL | Status: AC

## 2021-08-08 MED ORDER — ALUM & MAG HYDROXIDE-SIMETH 200-200-20 MG/5ML PO SUSP: 30.0000 mL | ORAL | Status: DC | PRN

## 2021-08-08 MED ORDER — PROPOFOL 10 MG/ML IV BOLUS
INTRAVENOUS | Status: DC | PRN
  Administered 2021-08-08: 30 mg via INTRAVENOUS
  Administered 2021-08-08: 50 ug/kg/min via INTRAVENOUS

## 2021-08-08 MED ORDER — CHLORHEXIDINE GLUCONATE 0.12 % MT SOLN: 15.0000 mL | Freq: Once | OROMUCOSAL | Status: AC

## 2021-08-08 MED ORDER — DIPHENHYDRAMINE HCL 12.5 MG/5ML PO ELIX
12.5000 mg | ORAL_SOLUTION | ORAL | Status: DC | PRN
  Filled 2021-08-08: qty 10

## 2021-08-08 MED ORDER — LEVOTHYROXINE SODIUM 125 MCG PO TABS
125.0000 ug | ORAL_TABLET | Freq: Every day | ORAL | Status: DC
  Filled 2021-08-08: qty 1

## 2021-08-08 MED ORDER — CEFAZOLIN SODIUM-DEXTROSE 2-4 GM/100ML-% IV SOLN
INTRAVENOUS | Status: AC
  Filled 2021-08-08: qty 100

## 2021-08-08 MED ORDER — SODIUM CHLORIDE 0.9 % IV SOLN: INTRAVENOUS | Status: DC

## 2021-08-08 MED ORDER — DEXAMETHASONE SODIUM PHOSPHATE 10 MG/ML IJ SOLN: 8.0000 mg | Freq: Once | INTRAMUSCULAR | Status: AC

## 2021-08-08 MED ORDER — ENOXAPARIN SODIUM 30 MG/0.3ML IJ SOSY
30.0000 mg | PREFILLED_SYRINGE | Freq: Two times a day (BID) | INTRAMUSCULAR | Status: DC
  Filled 2021-08-08: qty 0.3

## 2021-08-08 MED ORDER — FLUTICASONE PROPIONATE 50 MCG/ACT NA SUSP: 1.0000 | Freq: Every day | NASAL | Status: DC

## 2021-08-08 MED ORDER — ORAL CARE MOUTH RINSE: 15.0000 mL | Freq: Once | OROMUCOSAL | Status: AC

## 2021-08-08 MED ORDER — MAGNESIUM HYDROXIDE 400 MG/5ML PO SUSP: 30.0000 mL | Freq: Every day | ORAL | Status: DC

## 2021-08-08 MED ORDER — SODIUM CHLORIDE 0.9 % IR SOLN
Status: DC | PRN
  Administered 2021-08-08: 3000 mL

## 2021-08-08 MED ORDER — CARBOXYMETHYLCELLUL-GLYCERIN 0.5-0.9 % OP SOLN: 1.0000 [drp] | Freq: Two times a day (BID) | OPHTHALMIC | Status: DC | PRN

## 2021-08-08 MED ORDER — BUPIVACAINE HCL (PF) 0.25 % IJ SOLN
INTRAMUSCULAR | Status: DC | PRN
  Administered 2021-08-08: 60 mL

## 2021-08-08 MED ORDER — TRANEXAMIC ACID-NACL 1000-0.7 MG/100ML-% IV SOLN
INTRAVENOUS | Status: AC
  Filled 2021-08-08: qty 100

## 2021-08-08 MED ORDER — LORATADINE 10 MG PO TABS
10.0000 mg | ORAL_TABLET | Freq: Every day | ORAL | Status: DC | PRN
  Filled 2021-08-08: qty 1

## 2021-08-08 MED ORDER — GABAPENTIN 300 MG PO CAPS: 300.0000 mg | ORAL_CAPSULE | Freq: Once | ORAL | Status: AC

## 2021-08-08 MED ORDER — ACETAMINOPHEN 10 MG/ML IV SOLN
INTRAVENOUS | Status: AC
  Filled 2021-08-08: qty 100

## 2021-08-08 MED ORDER — SODIUM CHLORIDE 0.9 % IV SOLN
INTRAVENOUS | Status: DC | PRN
  Administered 2021-08-08: 60 mL

## 2021-08-08 MED ORDER — GABAPENTIN 300 MG PO CAPS
ORAL_CAPSULE | ORAL | Status: AC
  Administered 2021-08-08: 300 mg via ORAL
  Filled 2021-08-08: qty 1

## 2021-08-08 MED ORDER — FAMOTIDINE 20 MG PO TABS: 20.0000 mg | ORAL_TABLET | Freq: Once | ORAL | Status: AC

## 2021-08-08 MED ORDER — TRANEXAMIC ACID-NACL 1000-0.7 MG/100ML-% IV SOLN
1000.0000 mg | Freq: Once | INTRAVENOUS | Status: AC
  Administered 2021-08-08: 1000 mg via INTRAVENOUS

## 2021-08-08 MED ORDER — CELECOXIB 200 MG PO CAPS: 200.0000 mg | ORAL_CAPSULE | Freq: Two times a day (BID) | ORAL | Status: DC

## 2021-08-08 MED ORDER — NEOMYCIN-POLYMYXIN B GU 40-200000 IR SOLN
Status: DC | PRN
  Administered 2021-08-08: 14 mL

## 2021-08-08 MED ORDER — TRAMADOL HCL 50 MG PO TABS
50.0000 mg | ORAL_TABLET | Freq: Four times a day (QID) | ORAL | 0 refills | Status: AC | PRN
  Filled 2021-08-08 (×2): qty 30, 7d supply, fill #0

## 2021-08-08 MED ORDER — MENTHOL 3 MG MT LOZG
1.0000 | LOZENGE | OROMUCOSAL | Status: DC | PRN
  Filled 2021-08-08: qty 9

## 2021-08-08 MED ORDER — FENTANYL CITRATE (PF) 100 MCG/2ML IJ SOLN
INTRAMUSCULAR | Status: AC
  Filled 2021-08-08: qty 2

## 2021-08-08 MED ORDER — FENTANYL CITRATE (PF) 100 MCG/2ML IJ SOLN: 25.0000 ug | INTRAMUSCULAR | Status: DC | PRN

## 2021-08-08 MED ORDER — ACETAMINOPHEN 325 MG PO TABS: 325.0000 mg | ORAL_TABLET | Freq: Four times a day (QID) | ORAL | Status: DC | PRN

## 2021-08-08 MED ORDER — CELECOXIB 200 MG PO CAPS
ORAL_CAPSULE | ORAL | Status: AC
  Administered 2021-08-08: 400 mg via ORAL
  Filled 2021-08-08: qty 2

## 2021-08-08 MED ORDER — ADULT MULTIVITAMIN W/MINERALS CH
1.0000 | ORAL_TABLET | Freq: Every morning | ORAL | Status: DC
  Filled 2021-08-08: qty 1

## 2021-08-08 MED ORDER — DEXAMETHASONE SODIUM PHOSPHATE 10 MG/ML IJ SOLN
INTRAMUSCULAR | Status: AC
  Administered 2021-08-08: 8 mg via INTRAVENOUS
  Filled 2021-08-08: qty 1

## 2021-08-08 MED ORDER — ENOXAPARIN SODIUM 40 MG/0.4ML IJ SOSY
40.0000 mg | PREFILLED_SYRINGE | INTRAMUSCULAR | 0 refills | Status: DC
  Filled 2021-08-08: qty 5.6, 14d supply, fill #0
  Filled 2021-08-08: qty 14, 35d supply, fill #0

## 2021-08-08 MED ORDER — MIDAZOLAM HCL 5 MG/5ML IJ SOLN
INTRAMUSCULAR | Status: DC | PRN
  Administered 2021-08-08: 2 mg via INTRAVENOUS
  Administered 2021-08-08 (×2): 1 mg via INTRAVENOUS

## 2021-08-08 MED ORDER — FENTANYL CITRATE (PF) 100 MCG/2ML IJ SOLN
INTRAMUSCULAR | Status: DC | PRN
  Administered 2021-08-08: 50 ug via INTRAVENOUS
  Administered 2021-08-08 (×3): 25 ug via INTRAVENOUS
  Administered 2021-08-08: 50 ug via INTRAVENOUS
  Administered 2021-08-08: 25 ug via INTRAVENOUS

## 2021-08-08 MED ORDER — PRONTOSAN WOUND IRRIGATION OPTIME
TOPICAL | Status: DC | PRN
  Administered 2021-08-08: 1 via TOPICAL

## 2021-08-08 MED ORDER — CHLORHEXIDINE GLUCONATE 0.12 % MT SOLN
OROMUCOSAL | Status: AC
  Administered 2021-08-08: 15 mL via OROMUCOSAL
  Filled 2021-08-08: qty 15

## 2021-08-08 MED ORDER — CEFAZOLIN SODIUM-DEXTROSE 2-4 GM/100ML-% IV SOLN
2.0000 g | INTRAVENOUS | Status: AC
  Administered 2021-08-08: 2 g via INTRAVENOUS

## 2021-08-08 MED ORDER — ONDANSETRON HCL 4 MG/2ML IJ SOLN: 4.0000 mg | Freq: Once | INTRAMUSCULAR | Status: DC | PRN

## 2021-08-08 MED ORDER — LACTATED RINGERS IV SOLN: INTRAVENOUS | Status: DC

## 2021-08-08 MED ORDER — ONDANSETRON HCL 4 MG/2ML IJ SOLN
INTRAMUSCULAR | Status: DC | PRN
  Administered 2021-08-08: 4 mg via INTRAVENOUS

## 2021-08-08 MED ORDER — POLYVINYL ALCOHOL 1.4 % OP SOLN
1.0000 [drp] | OPHTHALMIC | Status: DC | PRN
  Filled 2021-08-08: qty 15

## 2021-08-08 MED ORDER — ACETAMINOPHEN 10 MG/ML IV SOLN
INTRAVENOUS | Status: DC | PRN
  Administered 2021-08-08: 1000 mg via INTRAVENOUS

## 2021-08-08 MED ORDER — PHENOL 1.4 % MT LIQD
1.0000 | OROMUCOSAL | Status: DC | PRN
  Filled 2021-08-08: qty 177

## 2021-08-08 MED ORDER — OXYCODONE HCL 5 MG PO TABS: 10.0000 mg | ORAL_TABLET | ORAL | Status: DC | PRN

## 2021-08-08 MED ORDER — MIDAZOLAM HCL 2 MG/2ML IJ SOLN
INTRAMUSCULAR | Status: AC
  Filled 2021-08-08: qty 2

## 2021-08-08 MED ORDER — PROPOFOL 1000 MG/100ML IV EMUL
INTRAVENOUS | Status: AC
  Filled 2021-08-08: qty 100

## 2021-08-08 MED ORDER — ONDANSETRON HCL 4 MG/2ML IJ SOLN: 4.0000 mg | Freq: Four times a day (QID) | INTRAMUSCULAR | Status: DC | PRN

## 2021-08-08 MED ORDER — LIDOCAINE HCL (PF) 1 % IJ SOLN
INTRAMUSCULAR | Status: DC | PRN
  Administered 2021-08-08: 1 mL via SUBCUTANEOUS

## 2021-08-08 MED ORDER — FLEET ENEMA 7-19 GM/118ML RE ENEM: 1.0000 | ENEMA | Freq: Once | RECTAL | Status: DC | PRN

## 2021-08-08 MED ORDER — 0.9 % SODIUM CHLORIDE (POUR BTL) OPTIME
TOPICAL | Status: DC | PRN
  Administered 2021-08-08: 500 mL

## 2021-08-08 MED ORDER — LACTATED RINGERS IV BOLUS
500.0000 mL | Freq: Once | INTRAVENOUS | Status: AC
  Administered 2021-08-08: 500 mL via INTRAVENOUS

## 2021-08-08 MED ORDER — PHENYLEPHRINE HCL-NACL 20-0.9 MG/250ML-% IV SOLN
INTRAVENOUS | Status: DC | PRN
  Administered 2021-08-08: 20 ug/min via INTRAVENOUS

## 2021-08-08 MED ORDER — ONDANSETRON HCL 4 MG PO TABS: 4.0000 mg | ORAL_TABLET | Freq: Four times a day (QID) | ORAL | Status: DC | PRN

## 2021-08-08 MED ORDER — ENSURE PRE-SURGERY PO LIQD
296.0000 mL | Freq: Once | ORAL | Status: DC
  Filled 2021-08-08: qty 296

## 2021-08-08 MED ORDER — BISACODYL 10 MG RE SUPP
10.0000 mg | Freq: Every day | RECTAL | Status: DC | PRN
  Filled 2021-08-08: qty 1

## 2021-08-08 MED ORDER — CEFAZOLIN SODIUM-DEXTROSE 2-4 GM/100ML-% IV SOLN: 2.0000 g | Freq: Four times a day (QID) | INTRAVENOUS | Status: DC

## 2021-08-08 MED ORDER — CHLORHEXIDINE GLUCONATE 4 % EX LIQD: 60.0000 mL | Freq: Once | CUTANEOUS | Status: DC

## 2021-08-08 MED ORDER — TRANEXAMIC ACID-NACL 1000-0.7 MG/100ML-% IV SOLN
1000.0000 mg | INTRAVENOUS | Status: AC
  Administered 2021-08-08: 1000 mg via INTRAVENOUS

## 2021-08-08 MED ORDER — FAMOTIDINE 20 MG PO TABS
ORAL_TABLET | ORAL | Status: AC
  Administered 2021-08-08: 20 mg via ORAL
  Filled 2021-08-08: qty 1

## 2021-08-08 SURGICAL SUPPLY — 84 items
ATTUNE MED DOME PAT 38 KNEE (Knees) ×1 IMPLANT
ATTUNE PS FEM LT SZ 6 CEM KNEE (Femur) ×1 IMPLANT
ATTUNE PSRP INSR SZ6 7 KNEE (Insert) ×1 IMPLANT
BASE TIBIAL ROT PLAT SZ 7 KNEE (Knees) IMPLANT
BATTERY INSTRU NAVIGATION (MISCELLANEOUS) ×8 IMPLANT
BLADE SAW 70X12.5 (BLADE) ×2 IMPLANT
BLADE SAW 90X13X1.19 OSCILLAT (BLADE) ×2 IMPLANT
BLADE SAW 90X25X1.19 OSCILLAT (BLADE) ×2 IMPLANT
BONE CEMENT GENTAMICIN (Cement) ×4 IMPLANT
CANISTER PREVENA 45 (CANNISTER) ×2 IMPLANT
CANISTER WOUND CARE 500ML ATS (WOUND CARE) ×1 IMPLANT
CEMENT BONE GENTAMICIN 40 (Cement) IMPLANT
COOLER POLAR GLACIER W/PUMP (MISCELLANEOUS) ×1 IMPLANT
CUFF TOURN SGL QUICK 24 (TOURNIQUET CUFF)
CUFF TOURN SGL QUICK 34 (TOURNIQUET CUFF)
CUFF TRNQT CYL 24X4X16.5-23 (TOURNIQUET CUFF) IMPLANT
CUFF TRNQT CYL 34X4.125X (TOURNIQUET CUFF) IMPLANT
DRAPE 3/4 80X56 (DRAPES) ×2 IMPLANT
DRAPE INCISE IOBAN 66X45 STRL (DRAPES) ×2 IMPLANT
DRSG DERMACEA 8X12 NADH (GAUZE/BANDAGES/DRESSINGS) ×1 IMPLANT
DRSG MEPILEX SACRM 8.7X9.8 (GAUZE/BANDAGES/DRESSINGS) ×2 IMPLANT
DRSG OPSITE POSTOP 4X14 (GAUZE/BANDAGES/DRESSINGS) ×1 IMPLANT
DRSG TEGADERM 4X4.75 (GAUZE/BANDAGES/DRESSINGS) ×1 IMPLANT
DURAPREP 26ML APPLICATOR (WOUND CARE) ×4 IMPLANT
ELECT CAUTERY BLADE 6.4 (BLADE) ×2 IMPLANT
ELECT REM PT RETURN 9FT ADLT (ELECTROSURGICAL) ×2
ELECTRODE REM PT RTRN 9FT ADLT (ELECTROSURGICAL) ×1 IMPLANT
EX-PIN ORTHOLOCK NAV 4X150 (PIN) ×4 IMPLANT
GLOVE SRG 8 PF TXTR STRL LF DI (GLOVE) ×1 IMPLANT
GLOVE SURG ENC TEXT LTX SZ7.5 (GLOVE) ×4 IMPLANT
GLOVE SURG UNDER POLY LF SZ7.5 (GLOVE) ×2 IMPLANT
GLOVE SURG UNDER POLY LF SZ8 (GLOVE) ×2
GOWN STRL REUS W/ TWL LRG LVL3 (GOWN DISPOSABLE) ×2 IMPLANT
GOWN STRL REUS W/ TWL XL LVL3 (GOWN DISPOSABLE) ×1 IMPLANT
GOWN STRL REUS W/TWL LRG LVL3 (GOWN DISPOSABLE) ×4
GOWN STRL REUS W/TWL XL LVL3 (GOWN DISPOSABLE) ×2
HEMOVAC 400CC 10FR (MISCELLANEOUS) ×1 IMPLANT
HOLDER FOLEY CATH W/STRAP (MISCELLANEOUS) ×2 IMPLANT
IV NS IRRIG 3000ML ARTHROMATIC (IV SOLUTION) ×2 IMPLANT
KIT PREVENA INCISION MGT20CM45 (CANNISTER) ×1 IMPLANT
KIT TURNOVER KIT A (KITS) ×2 IMPLANT
KNIFE SCULPS 14X20 (INSTRUMENTS) ×2 IMPLANT
LABEL OR SOLS (LABEL) ×2 IMPLANT
MANIFOLD NEPTUNE II (INSTRUMENTS) ×4 IMPLANT
NDL SAFETY ECLIPSE 18X1.5 (NEEDLE) ×1 IMPLANT
NDL SPNL 20GX3.5 QUINCKE YW (NEEDLE) ×2 IMPLANT
NEEDLE HYPO 18GX1.5 SHARP (NEEDLE) ×2
NEEDLE SPNL 20GX3.5 QUINCKE YW (NEEDLE) ×4 IMPLANT
NS IRRIG 500ML POUR BTL (IV SOLUTION) ×2 IMPLANT
PACK TOTAL KNEE (MISCELLANEOUS) ×2 IMPLANT
PAD ABD DERMACEA PRESS 5X9 (GAUZE/BANDAGES/DRESSINGS) ×4 IMPLANT
PAD WRAPON POLAR KNEE (MISCELLANEOUS) ×1 IMPLANT
PADDING CAST 4IN STRL (MISCELLANEOUS) ×3
PADDING CAST BLEND 4X4 STRL (MISCELLANEOUS) IMPLANT
PENCIL SMOKE EVACUATOR COATED (MISCELLANEOUS) ×2 IMPLANT
PIN DRILL FIX HALF THREAD (BIT) ×4 IMPLANT
PIN DRILL QUICK PACK ×3 IMPLANT
PIN FIXATION 1/8DIA X 3INL (PIN) ×2 IMPLANT
PULSAVAC PLUS IRRIG FAN TIP (DISPOSABLE) ×2
SOL PREP PVP 2OZ (MISCELLANEOUS) ×2
SOLUTION PREP PVP 2OZ (MISCELLANEOUS) ×1 IMPLANT
SPONGE DRAIN TRACH 4X4 STRL 2S (GAUZE/BANDAGES/DRESSINGS) ×2 IMPLANT
SPONGE KITTNER 5P (MISCELLANEOUS) ×1 IMPLANT
SPONGE T-LAP 18X18 ~~LOC~~+RFID (SPONGE) ×6 IMPLANT
STAPLER SKIN PROX 35W (STAPLE) ×2 IMPLANT
STOCKINETTE IMPERV 14X48 (MISCELLANEOUS) ×1 IMPLANT
STRAP TIBIA SHORT (MISCELLANEOUS) ×2 IMPLANT
SUCTION FRAZIER HANDLE 10FR (MISCELLANEOUS) ×4
SUCTION TUBE FRAZIER 10FR DISP (MISCELLANEOUS) ×1 IMPLANT
SUT MNCRL 0 1X36 CT-1 (SUTURE) IMPLANT
SUT MONOCRYL 0 (SUTURE) ×2
SUT VIC AB 0 CT1 36 (SUTURE) ×4 IMPLANT
SUT VIC AB 1 CT1 36 (SUTURE) ×4 IMPLANT
SUT VIC AB 2-0 CT2 27 (SUTURE) ×2 IMPLANT
SYR 20ML LL LF (SYRINGE) ×2 IMPLANT
SYR 30ML LL (SYRINGE) ×4 IMPLANT
TIBIAL BASE ROT PLAT SZ 7 KNEE (Knees) ×2 IMPLANT
TIP FAN IRRIG PULSAVAC PLUS (DISPOSABLE) ×1 IMPLANT
TOWEL OR 17X26 4PK STRL BLUE (TOWEL DISPOSABLE) ×2 IMPLANT
TOWER CARTRIDGE SMART MIX (DISPOSABLE) ×2 IMPLANT
TRAY FOLEY MTR SLVR 16FR STAT (SET/KITS/TRAYS/PACK) ×2 IMPLANT
WATER STERILE IRR 1000ML POUR (IV SOLUTION) ×2 IMPLANT
WATER STERILE IRR 500ML POUR (IV SOLUTION) ×2 IMPLANT
WRAPON POLAR PAD KNEE (MISCELLANEOUS) ×2

## 2021-08-08 NOTE — H&P (Signed)
The patient has been re-examined, and the chart reviewed, and there have been no interval changes to the documented history and physical.    The risks, benefits, and alternatives have been discussed at length. The patient expressed understanding of the risks benefits and agreed with plans for surgical intervention.  Addison P. Jaques Mineer, Jr. M.D.    

## 2021-08-08 NOTE — OR Nursing (Signed)
Explanted 1 screw from previous left knee surgery

## 2021-08-08 NOTE — Transfer of Care (Signed)
Immediate Anesthesia Transfer of Care Note  Patient: Shawn Phillips  Procedure(s) Performed: COMPUTER ASSISTED TOTAL KNEE ARTHROPLASTY (Left: Knee) APPLICATION OF WOUND VAC (Left: Knee)  Patient Location: PACU  Anesthesia Type:Spinal  Level of Consciousness: drowsy and patient cooperative  Airway & Oxygen Therapy: Patient Spontanous Breathing and Patient connected to face mask oxygen  Post-op Assessment: Report given to RN and Post -op Vital signs reviewed and stable  Post vital signs: Reviewed and stable  Last Vitals:  Vitals Value Taken Time  BP 126/70 08/08/21 1223  Temp    Pulse 59 08/08/21 1223  Resp 13 08/08/21 1223  SpO2 97 % 08/08/21 1223  Vitals shown include unvalidated device data.  Last Pain:  Vitals:   08/08/21 0646  TempSrc:   PainSc: 0-No pain         Complications: No notable events documented.

## 2021-08-08 NOTE — Op Note (Signed)
OPERATIVE NOTE  DATE OF SURGERY:  08/08/2021  PATIENT NAME:  Shawn Phillips   DOB: 07/14/1956  MRN: [id number]  PRE-OPERATIVE DIAGNOSIS: Degenerative arthrosis of the left knee, primary Retained hardware status post left ACL reconstruction  POST-OPERATIVE DIAGNOSIS:  Same  PROCEDURE:  Left total knee arthroplasty using computer-assisted navigation, Removal of retained hardware from the left knee  SURGEON:  Marciano Sequin. M.D.  ANESTHESIA: spinal  ESTIMATED BLOOD LOSS: 50 mL  FLUIDS REPLACED: 1400 mL of crystalloid  TOURNIQUET TIME: 120 minutes  DRAINS: Wound VAC  SOFT TISSUE RELEASES: Anterior cruciate ligament, posterior cruciate ligament, deep and superficial medial collateral ligament, patellofemoral ligament  IMPLANTS UTILIZED: DePuy Attune size 6 posterior stabilized femoral component (cemented), size 7 rotating platform tibial component (cemented), 38 mm medialized dome patella (cemented), and a 7 mm stabilized rotating platform polyethylene insert.  INDICATIONS FOR SURGERY: Shawn Phillips is a 65 y.o. year old male with a long history of progressive knee pain.  He had a remote history of a left ACL reconstruction with retained interference screws.  X-rays demonstrated severe degenerative changes in tricompartmental fashion. The patient had not seen any significant improvement despite conservative nonsurgical intervention. After discussion of the risks and benefits of surgical intervention, the patient expressed understanding of the risks benefits and agree with plans for total knee arthroplasty.   The risks, benefits, and alternatives were discussed at length including but not limited to the risks of infection, bleeding, nerve injury, stiffness, blood clots, the need for revision surgery, cardiopulmonary complications, among others, and they were willing to proceed.  PROCEDURE IN DETAIL: The patient was brought into the operating room and, after adequate spinal  anesthesia was achieved, a tourniquet was placed on the patient's upper thigh. The patient's knee and leg were cleaned and prepped with alcohol and DuraPrep and draped in the usual sterile fashion. A "timeout" was performed as per usual protocol. The lower extremity was exsanguinated using an Esmarch, and the tourniquet was inflated to 300 mmHg. An anterior longitudinal incision was made followed by a standard mid vastus approach. The deep fibers of the medial collateral ligament were elevated in a subperiosteal fashion off of the medial flare of the tibia so as to maintain a continuous soft tissue sleeve.  The head of the distal interference screw was identified.  The area was debrided of overlapping bone and the screw was removed using a 3.5 mm screwdriver.  The patella was subluxed laterally and the patellofemoral ligament was incised. Inspection of the knee demonstrated severe degenerative changes with full-thickness loss of articular cartilage. Osteophytes were debrided using a rongeur. Anterior and posterior cruciate ligaments were excised. Two 4.0 mm Schanz pins were inserted in the femur and into the tibia for attachment of the array of trackers used for computer-assisted navigation. Hip center was identified using a circumduction technique. Distal landmarks were mapped using the computer. The distal femur and proximal tibia were mapped using the computer. The distal femoral cutting guide was positioned using computer-assisted navigation so as to achieve a 5 distal valgus cut. The femur was sized and it was felt that a size 6 femoral component was appropriate. A size 6 femoral cutting guide was positioned and the anterior cut was performed and verified using the computer. This was followed by completion of the posterior and chamfer cuts. Femoral cutting guide for the central box was then positioned in the center box cut was performed.  Attention was then directed to the proximal tibia. Medial and  lateral  menisci were excised. The extramedullary tibial cutting guide was positioned using computer-assisted navigation so as to achieve a 0 varus-valgus alignment and 3 posterior slope. The cut was performed and verified using the computer. The proximal tibia was sized and it was felt that a size 7 tibial tray was appropriate. Tibial and femoral trials were inserted followed by insertion of a 7 mm polyethylene insert. The knee was felt to be tight medially. A Cobb elevator was used to elevate the superficial fibers of the medial collateral ligament.  This allowed for excellent mediolateral soft tissue balancing both in flexion and in full extension. Finally, the patella was cut and prepared so as to accommodate a 38 mm medialized dome patella. A patella trial was placed and the knee was placed through a range of motion with excellent patellar tracking appreciated. The femoral trial was removed after debridement of posterior osteophytes. The central post-hole for the tibial component was reamed followed by insertion of a keel punch. Tibial trials were then removed. Cut surfaces of bone were irrigated with copious amounts of normal saline using pulsatile lavage and then suctioned dry. Polymethylmethacrylate cement with gentamicin was prepared in the usual fashion using a vacuum mixer. Cement was applied to the cut surface of the proximal tibia as well as along the undersurface of a size 7 rotating platform tibial component. Tibial component was positioned and impacted into place. Excess cement was removed using [employer] fast streamer. Cement was then applied to the cut surfaces of the femur as well as along the posterior flanges of the size 6 femoral component. The femoral component was positioned and impacted into place. Excess cement was removed using [employer] fast streamer. A 7 mm polyethylene trial was inserted and the knee was brought into full extension with steady axial compression applied. Finally, cement was applied to the  backside of a 38 mm medialized dome patella and the patellar component was positioned and patellar clamp applied. Excess cement was removed using [employer] fast streamer. After adequate curing of the cement, the tourniquet was deflated after a total tourniquet time of 120 minutes. Hemostasis was achieved using electrocautery. The knee was irrigated with copious amounts of normal saline using pulsatile lavage followed by 500 ml of Surgiphor and then suctioned dry. 20 mL of 1.3% Exparel and 60 mL of 0.25% Marcaine in 40 mL of normal saline was injected along the posterior capsule, medial and lateral gutters, and along the arthrotomy site. A 7 mm stabilized rotating platform polyethylene insert was inserted and the knee was placed through a range of motion with excellent mediolateral soft tissue balancing appreciated and excellent patellar tracking noted.  The medial parapatellar portion of the incision was reapproximated using interrupted sutures of #1 Vicryl. Subcutaneous tissue was approximated in layers using first #0 Monocryl followed #2-0 Monocryl. The skin was approximated with skin staples. A sterile dressing consisting of a Prevena wound VAC was applied.  The patient tolerated the procedure well and was transported to the recovery room in stable condition.    Clemons P. Holley Bouche., M.D.

## 2021-08-08 NOTE — Anesthesia Preprocedure Evaluation (Signed)
Anesthesia Evaluation  Patient identified by MRN, date of birth, ID band Patient awake    Reviewed: Allergy & Precautions, H&P , NPO status , Patient's Chart, lab work & pertinent test results, reviewed documented beta blocker date and time   History of Anesthesia Complications (+) MALIGNANT HYPERTHERMIA and history of anesthetic complications  Airway Mallampati: II   Neck ROM: full    Dental  (+) Poor Dentition   Pulmonary neg pulmonary ROS,    Pulmonary exam normal        Cardiovascular negative cardio ROS Normal cardiovascular exam Rhythm:regular Rate:Normal     Neuro/Psych  Neuromuscular disease negative psych ROS   GI/Hepatic negative GI ROS, Neg liver ROS,   Endo/Other  Hypothyroidism   Renal/GU negative Renal ROS  negative genitourinary   Musculoskeletal   Abdominal   Peds  Hematology negative hematology ROS (+)   Anesthesia Other Findings Past Medical History: No date: Allergic rhinitis No date: Arthritis     Comment:  right knee No date: Complication of anesthesia     Comment:  a.) Malignant hyperthermia No date: Diverticulosis of colon 11/08/2014: Dysplastic nevus     Comment:  L sup med scapula - mild No date: Hypothyroidism No date: Malignant hyperthermia Past Surgical History: 1994: ANTERIOR CRUCIATE LIGAMENT REPAIR     Comment:  Left 1980: BACK SURGERY     Comment:  lumbar disk 04/12/2020: BALLOON DILATION; N/A     Comment:  Procedure: BALLOON DILATION;  Surgeon: Abbie Sons,              MD;  Location: [facility] ORS;  Service: Urology;  Laterality:               N/A; 04/12/2020: CYSTOSCOPY WITH URETHRAL DILATATION; N/A     Comment:  Procedure: CYSTOSCOPY WITH URETHRAL DILATATION;                Surgeon: Abbie Sons, MD;  Location: [facility] ORS;                Service: Urology;  Laterality: N/A; 10/21/2019: KNEE ARTHROPLASTY; Right     Comment:  Procedure: RIGHT COMPUTER ASSISTED TOTAL  KNEE               ARTHROPLASTY;  Surgeon: Dereck Leep, MD;  Location:               [facility] ORS;  Service: Orthopedics;  Laterality: Right; 1984: KNEE ARTHROSCOPY     Comment:  Right 2001: LASIK 2010: NASAL SINUS SURGERY     Comment:  left side, chronic septal perforation BMI    Body Mass Index: 25.82 kg/m     Reproductive/Obstetrics negative OB ROS                             Anesthesia Physical Anesthesia Plan  ASA: 2  Anesthesia Plan: Spinal   Post-op Pain Management:    Induction:   PONV Risk Score and Plan: 2  Airway Management Planned:   Additional Equipment:   Intra-op Plan:   Post-operative Plan:   Informed Consent: I have reviewed the patients History and Physical, chart, labs and discussed the procedure including the risks, benefits and alternatives for the proposed anesthesia with the patient or authorized representative who has indicated his/her understanding and acceptance.     Dental Advisory Given  Plan Discussed with: CRNA  Anesthesia Plan Comments: (Pt. With hx of MH. Situation disc. With patient  and risks and benefits of SAB with TIVA accepted. Pt with previous back surgery and ongoing lbp. JA)        Anesthesia Quick Evaluation

## 2021-08-08 NOTE — Evaluation (Signed)
Physical Therapy Evaluation Patient Details Name: Shawn Phillips MRN: [id number] DOB: 06/22/1956 Today's Date: 08/08/2021  History of Present Illness  Pt is a 65 year old male who presents for elective L TKA with Dr. Hooten on 08/08/2021. PMH: Hypothyroidism, malignant hyperthermia, Benign prostatic hyperplasia with incomplete bladder emptying. Past surgical history: ACL repair bilaterally, R TKA (Jan 2021), L4-5 L laminectomy and discectomy   Clinical Impression  Pt is a pleasant 65 year old male who presents to PT evaluation POD#0 after L TKA and currently WBAT. Pt accompanied by spouse throughout evaluation and reports that family is able to provide 24/7 assistance at discharge. Prior to admission, pt was independent with all mobility and did not use any assistive devices. Upon evaluation, pt completed bed mobility independently, transfers with SBA/CGA, ambulation of 40 ft x 2 with RW + CGA, and negotiated 4 steps with B handrails with CGA. Pt able to complete 10 SLR without extensor lag noted and L knee immobilizer not necessary. Pt and spouse given handouts regarding exercises for LE strengthening and stretching as well as information on usage of polar care and lower body dressing. No other questions at this time. Recommending home with family support and HHPT to improve L LE strength, ROM, and balance. No further acute PT needs, will complete PT orders.    This entire session was guided, instructed, and directly supervised by Stephanie Ray, DPT.    Recommendations for follow up therapy are one component of a multi-disciplinary discharge planning process, led by the attending physician.  Recommendations may be updated based on patient status, additional functional criteria and insurance authorization.  Follow Up Recommendations Home health PT    Assistance Recommended at Discharge Intermittent Supervision/Assistance  Functional Status Assessment Patient has had a recent decline in their  functional status and demonstrates the ability to make significant improvements in function in a reasonable and predictable amount of time.  Equipment Recommendations  None recommended by PT    Recommendations for Other Services       Precautions / Restrictions Precautions Precautions: Knee;Fall Restrictions Weight Bearing Restrictions: Yes LLE Weight Bearing: Weight bearing as tolerated      Mobility  Bed Mobility Overal bed mobility: Independent             General bed mobility comments: Able to move B LEs and trunk into sitting independently    Transfers Overall transfer level: Needs assistance Equipment used: Rolling walker (2 wheels) Transfers: Sit to/from Stand Sit to Stand: Min guard           General transfer comment: SBA/CGA for initial standing for balance. No reports of dizziness with initial standing. Able to stabilize independently with usage of RW    Ambulation/Gait Ambulation/Gait assistance: Min guard Gait Distance (Feet): 40 Feet Assistive device: Rolling walker (2 wheels) Gait Pattern/deviations: Step-through pattern;Antalgic       General Gait Details: Slightly antalgic with gait. No evidence of L knee buckling in stance.  Stairs Stairs: Yes Stairs assistance: Min guard Stair Management: Two rails;Step to pattern;Forwards Number of Stairs: 4 General stair comments: Stair training completed with spouse present. Pt able to negociate 4 steps with B handrails and CGA for safety. Educated pt's spouse on proper positioning for gaurding with stair climbing  Wheelchair Mobility    Modified Rankin (Stroke Patients Only)       Balance Overall balance assessment: Needs assistance Sitting-balance support: No upper extremity supported;Feet supported Sitting balance-Leahy Scale: Good     Standing balance support: Bilateral   upper extremity supported;During functional activity;Reliant on assistive device for balance Standing balance-Leahy  Scale: Good Standing balance comment: Usage of RW for increased stability post surgery. No loss of balance noted during static and dynamic balance activities                             Pertinent Vitals/Pain Pain Assessment: 0-10 Pain Score: 2  Pain Location: L knee Pain Descriptors / Indicators: Dull;Tender Pain Intervention(s): Limited activity within patient's tolerance;Monitored during session;Relaxation    Home Living Family/patient expects to be discharged to:: Private residence Living Arrangements: Spouse/significant other Available Help at Discharge: Family;Available 24 hours/day Type of Home: House Home Access: Stairs to enter Entrance Stairs-Rails: Right;Left;Can reach both Entrance Stairs-Number of Steps: 4   Home Layout: Multi-level;Able to live on main level with bedroom/bathroom Home Equipment: Rolling Walker (2 wheels);Rollator (4 wheels);BSC/3in1;Shower seat Additional Comments: Pt reports that family will be with him 24/7 at discharge    Prior Function Prior Level of Function : Independent/Modified Independent             Mobility Comments: Fully independent without AD. Reports being very physically active       Hand Dominance        Extremity/Trunk Assessment   Upper Extremity Assessment Upper Extremity Assessment: Overall WFL for tasks assessed    Lower Extremity Assessment Lower Extremity Assessment: LLE deficits/detail;Overall WFL for tasks assessed LLE Deficits / Details: Decreased R LE ROM, able to move extremity throughout available ROM against gravity LLE Sensation: WNL LLE Coordination: WNL       Communication   Communication: No difficulties  Cognition Arousal/Alertness: Awake/alert Behavior During Therapy: WFL for tasks assessed/performed Overall Cognitive Status: Within Functional Limits for tasks assessed                                          General Comments      Exercises Total Joint  Exercises Ankle Circles/Pumps: AROM;Both;10 reps;Supine Quad Sets: AROM;Strengthening;Left;10 reps;Supine Short Arc Quad: AROM;Strengthening;Left;10 reps;Supine Heel Slides: AROM;Left;5 reps;Supine Hip ABduction/ADduction: AROM;Strengthening;Both;10 reps;Supine Straight Leg Raises: AROM;Strengthening;Left;10 reps;Supine Long Arc Quad: AROM;Strengthening;Left;5 reps;Seated Knee Flexion: AROM;Left;5 reps;Seated Goniometric ROM: 4-87 Other Exercises Other Exercises: Reviewed and gave HEP for LE strengthening and AROM. Provided education regarding proper positioning with no pillows underneath the knee to promote L knee extension. Other Exercises: Polar care and LE dressing handout given and reviewed with patient and spouse Other Exercises: Discussed car transfer techniques   Assessment/Plan    PT Assessment All further PT needs can be met in the next venue of care  PT Problem List Decreased strength;Decreased range of motion;Decreased mobility;Decreased activity tolerance;Decreased balance;Decreased knowledge of use of DME;Pain       PT Treatment Interventions      PT Goals (Current goals can be found in the Care Plan section)  Acute Rehab PT Goals Patient Stated Goal: to go home PT Goal Formulation: With patient/family Time For Goal Achievement: 08/08/21 Potential to Achieve Goals: Good    Frequency     Barriers to discharge        Co-evaluation               AM-PAC PT "6 Clicks" Mobility  Outcome Measure Help needed turning from your back to your side while in a flat bed without using bedrails?: None Help needed moving from lying  on your back to sitting on the side of a flat bed without using bedrails?: None Help needed moving to and from a bed to a chair (including a wheelchair)?: A Little Help needed standing up from a chair using your arms (e.g., wheelchair or bedside chair)?: A Little Help needed to walk in hospital room?: A Little Help needed climbing 3-5 steps  with a railing? : A Little 6 Click Score: 20    End of Session Equipment Utilized During Treatment: Gait belt Activity Tolerance: Patient tolerated treatment well Patient left: in bed;with call bell/phone within reach;with family/visitor present Nurse Communication: Mobility status PT Visit Diagnosis: Muscle weakness (generalized) (M62.81);Other abnormalities of gait and mobility (R26.89)    Time: 1445-1518 PT Time Calculation (min) (ACUTE ONLY): 33 min   Charges:   PT Evaluation $PT Eval Low Complexity: 1 Low PT Treatments $Gait Training: 8-22 mins        Elizabeth Landers, SPT   Elizabeth Landers 08/08/2021, 3:45 PM  

## 2021-08-09 ENCOUNTER — Encounter: Payer: Self-pay | Admitting: Orthopedic Surgery

## 2021-08-09 ENCOUNTER — Telehealth: Payer: Self-pay | Admitting: Urology

## 2021-08-09 NOTE — Telephone Encounter (Signed)
Shawn Phillips from [facility] [phone], Ext [phone]) called the office.   They need the order form to be SIGNED & DATED.  They also need office notes, SIGNED & DATED. These items need to be faxed back to them asap.   She is faxing over a request also.   Patient is waiting for catheter supplies.

## 2021-08-10 ENCOUNTER — Encounter: Payer: Self-pay | Admitting: Orthopedic Surgery

## 2021-08-10 NOTE — Anesthesia Postprocedure Evaluation (Signed)
Anesthesia Post Note  Patient: Shawn Phillips  Procedure(s) Performed: COMPUTER ASSISTED TOTAL KNEE ARTHROPLASTY (Left: Knee) APPLICATION OF WOUND VAC (Left: Knee)  Patient location during evaluation: PACU Anesthesia Type: Spinal Level of consciousness: awake and alert Pain management: pain level controlled Vital Signs Assessment: post-procedure vital signs reviewed and stable Respiratory status: spontaneous breathing, nonlabored ventilation, respiratory function stable and patient connected to nasal cannula oxygen Cardiovascular status: blood pressure returned to baseline and stable Postop Assessment: no apparent nausea or vomiting Anesthetic complications: no   No notable events documented.   Last Vitals:  Vitals:   08/08/21 1356 08/08/21 1632  BP: 125/69 (!) 143/71  Pulse: (!) 56 (!) 51  Resp: 16 15  Temp: (!) 36.1 C (!) 36.2 C  SpO2: 96% 99%    Last Pain:  Vitals:   08/08/21 1632  TempSrc: Temporal  PainSc: 2                  Molli Barrows

## 2021-08-18 ENCOUNTER — Encounter: Payer: Self-pay | Admitting: Urology

## 2021-08-18 ENCOUNTER — Telehealth: Payer: Self-pay

## 2021-08-18 NOTE — Telephone Encounter (Signed)
Incoming call from [place] at [facility] who states that this pt is waiting on catheters due to order form being sent in with no date. Also they are requesting again that office notes be sent in with order for caths so that insurance will cover.

## 2021-12-06 ENCOUNTER — Encounter: Payer: Medicare Other | Admitting: Dermatology

## 2021-12-06 ENCOUNTER — Other Ambulatory Visit: Payer: Self-pay

## 2021-12-06 ENCOUNTER — Ambulatory Visit (INDEPENDENT_AMBULATORY_CARE_PROVIDER_SITE_OTHER): Payer: Medicare Other | Admitting: Dermatology

## 2021-12-06 DIAGNOSIS — Z86018 Personal history of other benign neoplasm: Secondary | ICD-10-CM | POA: Diagnosis not present

## 2021-12-06 DIAGNOSIS — Z1283 Encounter for screening for malignant neoplasm of skin: Secondary | ICD-10-CM

## 2021-12-06 DIAGNOSIS — L570 Actinic keratosis: Secondary | ICD-10-CM | POA: Diagnosis not present

## 2021-12-06 DIAGNOSIS — D239 Other benign neoplasm of skin, unspecified: Secondary | ICD-10-CM

## 2021-12-06 DIAGNOSIS — L821 Other seborrheic keratosis: Secondary | ICD-10-CM

## 2021-12-06 DIAGNOSIS — D229 Melanocytic nevi, unspecified: Secondary | ICD-10-CM

## 2021-12-06 DIAGNOSIS — L578 Other skin changes due to chronic exposure to nonionizing radiation: Secondary | ICD-10-CM | POA: Diagnosis not present

## 2021-12-06 DIAGNOSIS — D1800 Hemangioma unspecified site: Secondary | ICD-10-CM

## 2021-12-06 DIAGNOSIS — L814 Other melanin hyperpigmentation: Secondary | ICD-10-CM

## 2021-12-06 NOTE — Progress Notes (Unsigned)
° °  Follow-Up Visit   Subjective  Shawn Phillips is a 66 y.o. male who presents for the following: Annual Exam (Hx dysplastic nevus ). The patient presents for Total-Body Skin Exam (TBSE) for skin cancer screening and mole check.  The patient has spots, moles and lesions to be evaluated, some may be new or changing.   The following portions of the chart were reviewed this encounter and updated as appropriate:       Review of Systems:  No other skin or systemic complaints except as noted in HPI or Assessment and Plan.  Objective  Well appearing patient in no apparent distress; mood and affect are within normal limits.  A full examination was performed including scalp, head, eyes, ears, nose, lips, neck, chest, axillae, abdomen, back, buttocks, bilateral upper extremities, bilateral lower extremities, hands, feet, fingers, toes, fingernails, and toenails. All findings within normal limits unless otherwise noted below.  Scalp x 1 Erythematous thin papules/macules with gritty scale.     Assessment & Plan  AK (actinic keratosis) Scalp x 1  Destruction of lesion - Scalp x 1 Complexity: simple   Destruction method: cryotherapy   Informed consent: discussed and consent obtained   Timeout:  patient name, date of birth, surgical site, and procedure verified Lesion destroyed using liquid nitrogen: Yes   Region frozen until ice ball extended beyond lesion: Yes   Outcome: patient tolerated procedure well with no complications   Post-procedure details: wound care instructions given     Lentigines - Scattered tan macules - Due to sun exposure - Benign-appearing, observe - Recommend daily broad spectrum sunscreen SPF 30+ to sun-exposed areas, reapply every 2 hours as needed. - Call for any changes  Seborrheic Keratoses - Stuck-on, waxy, tan-brown papules and/or plaques  - Benign-appearing - Discussed benign etiology and prognosis. - Observe - Call for any changes  Melanocytic  Nevi - Tan-brown and/or pink-flesh-colored symmetric macules and papules - Benign appearing on exam today - Observation - Call clinic for new or changing moles - Recommend daily use of broad spectrum spf 30+ sunscreen to sun-exposed areas.   Hemangiomas - Red papules - Discussed benign nature - Observe - Call for any changes  Actinic Damage - Chronic condition, secondary to cumulative UV/sun exposure - diffuse scaly erythematous macules with underlying dyspigmentation - Recommend daily broad spectrum sunscreen SPF 30+ to sun-exposed areas, reapply every 2 hours as needed.  - Staying in the shade or wearing long sleeves, sun glasses (UVA+UVB protection) and wide brim hats (4-inch brim around the entire circumference of the hat) are also recommended for sun protection.  - Call for new or changing lesions.  Dermatofibroma - Firm pink/brown papulenodule with dimple sign - Benign appearing - Call for any changes  History of Dysplastic Nevi - No evidence of recurrence today - Recommend regular full body skin exams - Recommend daily broad spectrum sunscreen SPF 30+ to sun-exposed areas, reapply every 2 hours as needed.  - Call if any new or changing lesions are noted between office visits  Skin cancer screening performed today.  Return in about 1 year (around 12/07/2022) for TBSE.  Luther Redo, CMA, am acting as scribe for Sarina Ser, MD .

## 2021-12-06 NOTE — Patient Instructions (Signed)

## 2021-12-12 ENCOUNTER — Encounter: Payer: Self-pay | Admitting: Dermatology

## 2022-01-23 ENCOUNTER — Other Ambulatory Visit: Payer: Self-pay | Admitting: Neurosurgery

## 2022-01-23 DIAGNOSIS — M5440 Lumbago with sciatica, unspecified side: Secondary | ICD-10-CM

## 2022-02-13 ENCOUNTER — Ambulatory Visit
Admission: RE | Admit: 2022-02-13 | Discharge: 2022-02-13 | Disposition: A | Discharge status: Home / Self Care | Payer: Medicare Other | Source: Ambulatory Visit | Attending: Neurosurgery | Admitting: Neurosurgery

## 2022-02-13 DIAGNOSIS — M5440 Lumbago with sciatica, unspecified side: Secondary | ICD-10-CM

## 2022-02-13 MED ORDER — GADOBENATE DIMEGLUMINE 529 MG/ML IV SOLN
15.0000 mL | Freq: Once | INTRAVENOUS | Status: AC | PRN
  Administered 2022-02-13: 15 mL via INTRAVENOUS

## 2022-05-14 ENCOUNTER — Encounter: Payer: Medicare Other | Admitting: Dermatology

## 2022-06-13 ENCOUNTER — Ambulatory Visit (INDEPENDENT_AMBULATORY_CARE_PROVIDER_SITE_OTHER): Payer: Medicare Other | Admitting: Dermatology

## 2022-06-13 DIAGNOSIS — D1801 Hemangioma of skin and subcutaneous tissue: Secondary | ICD-10-CM

## 2022-06-13 DIAGNOSIS — L578 Other skin changes due to chronic exposure to nonionizing radiation: Secondary | ICD-10-CM | POA: Diagnosis not present

## 2022-06-13 DIAGNOSIS — L570 Actinic keratosis: Secondary | ICD-10-CM

## 2022-06-13 DIAGNOSIS — Z1283 Encounter for screening for malignant neoplasm of skin: Secondary | ICD-10-CM | POA: Diagnosis not present

## 2022-06-13 DIAGNOSIS — D485 Neoplasm of uncertain behavior of skin: Secondary | ICD-10-CM

## 2022-06-13 DIAGNOSIS — D2371 Other benign neoplasm of skin of right lower limb, including hip: Secondary | ICD-10-CM | POA: Diagnosis not present

## 2022-06-13 DIAGNOSIS — B078 Other viral warts: Secondary | ICD-10-CM | POA: Diagnosis not present

## 2022-06-13 DIAGNOSIS — L814 Other melanin hyperpigmentation: Secondary | ICD-10-CM

## 2022-06-13 DIAGNOSIS — L820 Inflamed seborrheic keratosis: Secondary | ICD-10-CM

## 2022-06-13 DIAGNOSIS — L821 Other seborrheic keratosis: Secondary | ICD-10-CM | POA: Diagnosis not present

## 2022-06-13 DIAGNOSIS — D229 Melanocytic nevi, unspecified: Secondary | ICD-10-CM

## 2022-06-13 NOTE — Patient Instructions (Addendum)
Cryotherapy Aftercare  Wash gently with soap and water everyday.   Apply Vaseline and Band-Aid daily until healed.  Wound Care Instructions  Cleanse wound gently with soap and water once a day then pat dry with clean gauze. Apply a thin coat of Petrolatum (petroleum jelly, "Vaseline") over the wound (unless you have an allergy to this). We recommend that you use a new, sterile tube of Vaseline. Do not pick or remove scabs. Do not remove the yellow or white "healing tissue" from the base of the wound.  Cover the wound with fresh, clean, nonstick gauze and secure with paper tape. You may use Band-Aids in place of gauze and tape if the wound is small enough, but would recommend trimming much of the tape off as there is often too much. Sometimes Band-Aids can irritate the skin.  You should call the office for your biopsy report after 1 week if you have not already been contacted.  If you experience any problems, such as abnormal amounts of bleeding, swelling, significant bruising, significant pain, or evidence of infection, please call the office immediately.  FOR ADULT SURGERY PATIENTS: If you need something for pain relief you may take 1 extra strength Tylenol (acetaminophen) AND 2 Ibuprofen (200mg each) together every 4 hours as needed for pain. (do not take these if you are allergic to them or if you have a reason you should not take them.) Typically, you may only need pain medication for 1 to 3 days.      Due to recent changes in healthcare laws, you may see results of your pathology and/or laboratory studies on MyChart before the doctors have had a chance to review them. We understand that in some cases there may be results that are confusing or concerning to you. Please understand that not all results are received at the same time and often the doctors may need to interpret multiple results in order to provide you with the best plan of care or course of treatment. Therefore, we ask that you  please give us 2 business days to thoroughly review all your results before contacting the office for clarification. Should we see a critical lab result, you will be contacted sooner.   If You Need Anything After Your Visit  If you have any questions or concerns for your doctor, please call our main line at [phone] and press option 4 to reach your doctor's medical assistant. If no one answers, please leave a voicemail as directed and we will return your call as soon as possible. Messages left after 4 pm will be answered the following business day.   You may also send us a message via MyChart. We typically respond to MyChart messages within 1-2 business days.  For prescription refills, please ask your pharmacy to contact our office. Our fax number is [phone].  If you have an urgent issue when the clinic is closed that cannot wait until the next business day, you can page your doctor at the number below.    Please note that while we do our best to be available for urgent issues outside of office hours, we are not available 24/7.   If you have an urgent issue and are unable to reach us, you may choose to seek medical care at your doctor's office, retail clinic, urgent care center, or emergency room.  If you have a medical emergency, please immediately call 911 or go to the emergency department.  Pager Numbers  - Dr. Kowalski: [phone]  -   Dr. Moye: [phone]  - Dr. Stewart: [phone]  In the event of inclement weather, please call our main line at [phone] for an update on the status of any delays or closures.  Dermatology Medication Tips: Please keep the boxes that topical medications come in in order to help keep track of the instructions about where and how to use these. Pharmacies typically print the medication instructions only on the boxes and not directly on the medication tubes.   If your medication is too expensive, please contact our office at  [phone] option 4 or send us a message through MyChart.   We are unable to tell what your co-pay for medications will be in advance as this is different depending on your insurance coverage. However, we may be able to find a substitute medication at lower cost or fill out paperwork to get insurance to cover a needed medication.   If a prior authorization is required to get your medication covered by your insurance company, please allow us 1-2 business days to complete this process.  Drug prices often vary depending on where the prescription is filled and some pharmacies may offer cheaper prices.  The website www.goodrx.com contains coupons for medications through different pharmacies. The prices here do not account for what the cost may be with help from insurance (it may be cheaper with your insurance), but the website can give you the price if you did not use any insurance.  - You can print the associated coupon and take it with your prescription to the pharmacy.  - You may also stop by our office during regular business hours and pick up a GoodRx coupon card.  - If you need your prescription sent electronically to a different pharmacy, notify our office through [facility] MyChart or by phone at [phone] option 4.     Si Usted Necesita Algo Despus de Su Visita  Tambin puede enviarnos un mensaje a travs de MyChart. Por lo general respondemos a los mensajes de MyChart en el transcurso de 1 a 2 das hbiles.  Para renovar recetas, por favor pida a su farmacia que se ponga en contacto con nuestra oficina. Nuestro nmero de fax es el [phone].  Si tiene un asunto urgente cuando la clnica est cerrada y que no puede esperar hasta el siguiente da hbil, puede llamar/localizar a su doctor(a) al nmero que aparece a continuacin.   Por favor, tenga en cuenta que aunque hacemos todo lo posible para estar disponibles para asuntos urgentes fuera del horario de oficina, no estamos  disponibles las 24 horas del da, los 7 das de la semana.   Si tiene un problema urgente y no puede comunicarse con nosotros, puede optar por buscar atencin mdica  en el consultorio de su doctor(a), en una clnica privada, en un centro de atencin urgente o en una sala de emergencias.  Si tiene una emergencia mdica, por favor llame inmediatamente al 911 o vaya a la sala de emergencias.  Nmeros de bper  - Dr. Kowalski: [phone]  - Dra. Moye: [phone]  - Dra. Stewart: [phone]  En caso de inclemencias del tiempo, por favor llame a nuestra lnea principal al [phone] para una actualizacin sobre el estado de cualquier retraso o cierre.  Consejos para la medicacin en dermatologa: Por favor, guarde las cajas en las que vienen los medicamentos de uso tpico para ayudarle a seguir las instrucciones sobre dnde y cmo usarlos. Las farmacias generalmente imprimen las instrucciones del medicamento slo en las cajas y   no directamente en los tubos del medicamento.   Si su medicamento es muy caro, por favor, pngase en contacto con nuestra oficina llamando al [phone] y presione la opcin 4 o envenos un mensaje a travs de MyChart.   No podemos decirle cul ser su copago por los medicamentos por adelantado ya que esto es diferente dependiendo de la cobertura de su seguro. Sin embargo, es posible que podamos encontrar un medicamento sustituto a menor costo o llenar un formulario para que el seguro cubra el medicamento que se considera necesario.   Si se requiere una autorizacin previa para que su compaa de seguros cubra su medicamento, por favor permtanos de 1 a 2 das hbiles para completar este proceso.  Los precios de los medicamentos varan con frecuencia dependiendo del lugar de dnde se surte la receta y alguna farmacias pueden ofrecer precios ms baratos.  El sitio web www.goodrx.com tiene cupones para medicamentos de diferentes farmacias. Los precios aqu no  tienen en cuenta lo que podra costar con la ayuda del seguro (puede ser ms barato con su seguro), pero el sitio web puede darle el precio si no utiliz ningn seguro.  - Puede imprimir el cupn correspondiente y llevarlo con su receta a la farmacia.  - Tambin puede pasar por nuestra oficina durante el horario de atencin regular y recoger una tarjeta de cupones de GoodRx.  - Si necesita que su receta se enve electrnicamente a una farmacia diferente, informe a nuestra oficina a travs de MyChart de [facility] o por telfono llamando al [phone] y presione la opcin 4.  

## 2022-06-13 NOTE — Progress Notes (Signed)
Follow-Up Visit   Subjective  Shawn Phillips is a 66 y.o. male who presents for the following: Other (Spots of right thumb, right lower leg and scalp - The patient presents for Upper Body Skin Exam (UBSE) for skin cancer screening and mole check.  The patient has spots, moles and lesions to be evaluated, some may be new or changing and the patient has concerns that these could be cancer./).  The following portions of the chart were reviewed this encounter and updated as appropriate:   Tobacco  Allergies  Meds  Problems  Med Hx  Surg Hx  Fam Hx     Review of Systems:  No other skin or systemic complaints except as noted in HPI or Assessment and Plan.  Objective  Well appearing patient in no apparent distress; mood and affect are within normal limits.  A focused examination was performed including all skin from waist up and right lower leg. Relevant physical exam findings are noted in the Assessment and Plan.  Scalp (2) Erythematous thin papules/macules with gritty scale.   Right Thumb Tip Verrucous papules -- Discussed viral etiology and contagion.   Below right medial knee 0.4 cm papule  Left Lateral Canthus Erythematous stuck-on, waxy papule or plaque   Assessment & Plan   Lentigines - Scattered tan macules - Due to sun exposure - Benign-appearing, observe - Recommend daily broad spectrum sunscreen SPF 30+ to sun-exposed areas, reapply every 2 hours as needed. - Call for any changes  Seborrheic Keratoses - Stuck-on, waxy, tan-brown papules and/or plaques  - Benign-appearing - Discussed benign etiology and prognosis. - Observe - Call for any changes  Melanocytic Nevi - Tan-brown and/or pink-flesh-colored symmetric macules and papules - Benign appearing on exam today - Observation - Call clinic for new or changing moles - Recommend daily use of broad spectrum spf 30+ sunscreen to sun-exposed areas.   Hemangiomas - Red papules - Discussed benign  nature - Observe - Call for any changes  Actinic Damage - Chronic condition, secondary to cumulative UV/sun exposure - diffuse scaly erythematous macules with underlying dyspigmentation - Recommend daily broad spectrum sunscreen SPF 30+ to sun-exposed areas, reapply every 2 hours as needed.  - Staying in the shade or wearing long sleeves, sun glasses (UVA+UVB protection) and wide brim hats (4-inch brim around the entire circumference of the hat) are also recommended for sun protection.  - Call for new or changing lesions.  Skin cancer screening performed today.  AK (actinic keratosis) (2) Scalp Destruction of lesion - Scalp Complexity: simple   Destruction method: cryotherapy   Informed consent: discussed and consent obtained   Timeout:  patient name, date of birth, surgical site, and procedure verified Lesion destroyed using liquid nitrogen: Yes   Region frozen until ice ball extended beyond lesion: Yes   Outcome: patient tolerated procedure well with no complications   Post-procedure details: wound care instructions given    Other viral warts Right Thumb Tip Destruction of lesion - Right Thumb Tip Complexity: simple   Destruction method: cryotherapy   Informed consent: discussed and consent obtained   Timeout:  patient name, date of birth, surgical site, and procedure verified Lesion destroyed using liquid nitrogen: Yes   Region frozen until ice ball extended beyond lesion: Yes   Outcome: patient tolerated procedure well with no complications   Post-procedure details: wound care instructions given    Neoplasm of uncertain behavior of skin Below right medial knee Epidermal / dermal shaving Lesion diameter (cm):  0.4 Informed consent: discussed and consent obtained   Timeout: patient name, date of birth, surgical site, and procedure verified   Procedure prep:  Patient was prepped and draped in usual sterile fashion Prep type:  Isopropyl alcohol Anesthesia: the lesion was  anesthetized in a standard fashion   Anesthetic:  1% lidocaine w/ epinephrine 1-100,000 buffered w/ 8.4% NaHCO3 Instrument used: flexible razor blade   Hemostasis achieved with: pressure, aluminum chloride and electrodesiccation   Outcome: patient tolerated procedure well   Post-procedure details: sterile dressing applied and wound care instructions given   Dressing type: bandage and petrolatum    Specimen 1 - Surgical pathology Differential Diagnosis: Nevus vs other Check Margins: No  Inflamed seborrheic keratosis Left Lateral Canthus Upper eyelid margin Symptomatic, irritating, patient would like treated. Destruction of lesion - Left Lateral Canthus Complexity: simple   Destruction method: cryotherapy   Informed consent: discussed and consent obtained   Timeout:  patient name, date of birth, surgical site, and procedure verified Lesion destroyed using liquid nitrogen: Yes   Region frozen until ice ball extended beyond lesion: Yes   Outcome: patient tolerated procedure well with no complications   Post-procedure details: wound care instructions given    Return for Follow up as scheduled, TBSE.  I, Ashok Cordia, CMA, am acting as scribe for Sarina Ser, MD . Documentation: I have reviewed the above documentation for accuracy and completeness, and I agree with the above.  Sarina Ser, MD

## 2022-06-14 ENCOUNTER — Telehealth: Payer: Self-pay

## 2022-06-14 NOTE — Telephone Encounter (Signed)
Advised patient of biopsy results/hd

## 2022-06-14 NOTE — Telephone Encounter (Signed)
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

## 2022-06-17 ENCOUNTER — Encounter: Payer: Self-pay | Admitting: Dermatology

## 2022-08-02 ENCOUNTER — Ambulatory Visit: Payer: Medicare Other | Admitting: Urology

## 2022-08-03 ENCOUNTER — Encounter: Payer: Self-pay | Admitting: Urology

## 2022-10-11 ENCOUNTER — Ambulatory Visit
Admission: RE | Admit: 2022-10-11 | Discharge: 2022-10-11 | Disposition: A | Discharge status: Home / Self Care | Payer: Medicare Other | Source: Ambulatory Visit | Attending: Family Medicine | Admitting: Family Medicine

## 2022-10-11 ENCOUNTER — Other Ambulatory Visit: Payer: Self-pay | Admitting: Family Medicine

## 2022-10-11 DIAGNOSIS — M79604 Pain in right leg: Secondary | ICD-10-CM

## 2022-10-11 DIAGNOSIS — M7989 Other specified soft tissue disorders: Secondary | ICD-10-CM | POA: Insufficient documentation

## 2022-11-02 ENCOUNTER — Other Ambulatory Visit
Admission: RE | Admit: 2022-11-02 | Discharge: 2022-11-02 | Disposition: A | Discharge status: Home / Self Care | Payer: Medicare Other | Source: Ambulatory Visit | Attending: Internal Medicine | Admitting: Internal Medicine

## 2022-11-02 ENCOUNTER — Other Ambulatory Visit: Payer: Self-pay | Admitting: Internal Medicine

## 2022-11-02 ENCOUNTER — Ambulatory Visit
Admission: RE | Admit: 2022-11-02 | Discharge: 2022-11-02 | Disposition: A | Discharge status: Home / Self Care | Payer: Medicare Other | Source: Ambulatory Visit | Attending: Internal Medicine | Admitting: Internal Medicine

## 2022-11-02 ENCOUNTER — Telehealth (INDEPENDENT_AMBULATORY_CARE_PROVIDER_SITE_OTHER): Payer: Self-pay | Admitting: Nurse Practitioner

## 2022-11-02 DIAGNOSIS — M7989 Other specified soft tissue disorders: Secondary | ICD-10-CM | POA: Insufficient documentation

## 2022-11-02 DIAGNOSIS — M79604 Pain in right leg: Secondary | ICD-10-CM | POA: Insufficient documentation

## 2022-11-02 LAB — D-DIMER, QUANTITATIVE: D-Dimer, Quant: 1.61 ug/mL-FEU — ABNORMAL HIGH (ref 0.00–0.50)

## 2022-11-02 NOTE — Telephone Encounter (Signed)
Called patient and he stated something came up and he has to go out of town.  He wanted to leave it as is.

## 2022-11-02 NOTE — Telephone Encounter (Signed)
Patient called and LVM that he needed an earlier appointment due to his leg still swollen.  He has an appointment on 2/19.  Please advise.

## 2022-11-02 NOTE — Telephone Encounter (Signed)
If there is sooner availability with schedule

## 2022-11-08 ENCOUNTER — Telehealth: Payer: Self-pay | Admitting: Urology

## 2022-11-08 NOTE — Telephone Encounter (Signed)
[facility] called asking if we rec'd fax about this pt.  Their contact info is:  Phone [phone] Fax# [phone]

## 2022-11-19 ENCOUNTER — Ambulatory Visit (INDEPENDENT_AMBULATORY_CARE_PROVIDER_SITE_OTHER): Payer: Medicare Other | Admitting: Vascular Surgery

## 2022-11-19 ENCOUNTER — Encounter (INDEPENDENT_AMBULATORY_CARE_PROVIDER_SITE_OTHER): Payer: Self-pay | Admitting: Vascular Surgery

## 2022-11-19 VITALS — BP 154/72 | HR 52 | Resp 16 | Wt 161.8 lb

## 2022-11-19 DIAGNOSIS — M7989 Other specified soft tissue disorders: Secondary | ICD-10-CM | POA: Diagnosis not present

## 2022-11-19 DIAGNOSIS — M1711 Unilateral primary osteoarthritis, right knee: Secondary | ICD-10-CM | POA: Diagnosis not present

## 2022-11-19 NOTE — Progress Notes (Signed)
MRN : [id number]  Shawn Phillips is a 67 y.o. (1955-11-04) male who presents with chief complaint of legs hurt and swell.  History of Present Illness:   The patient presents to the office for evaluation of DVT.  DVT was identified at [facility] by Duplex ultrasound.  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  The patient developed swelling of the right lower leg. He first noted the onset of the swelling in early January and was evaluated by Grayland Ormond, PA-C in Medicine. Venous Dopplers obtained on 10/11/2022 were negative for deep venous thrombosis. He was apparently placed on Keflex with resolution of the swelling. However, he noted return of some lesser edema after completion of the antibiotics. He denies any fevers or chills. He denies any shortness of breath. He has not appreciated any redness or rash to the right lower extremity. The initial symptoms were pain and swelling in the lower extremity.  The patient notes the leg continues to be very painful with dependency and swells quite a bite.  Symptoms are much better with elevation.  The patient notes minimal edema in the morning which steadily worsens throughout the day.    The patient has not been using compression therapy at this point.  No SOB or pleuritic chest pains.  No cough or hemoptysis.  No blood per rectum or blood in any sputum.  No excessive bruising per the patient.   No recent shortening of the patient's walking distance or new symptoms consistent with claudication.  No history of rest pain symptoms. No new ulcers or wounds of the lower extremities have occurred.  The patient denies amaurosis fugax or recent TIA symptoms. There are no recent neurological changes noted. No recent episodes of angina or shortness of breath documented.   No outpatient medications have been marked as taking for the 11/19/22 encounter (Appointment) with Delana Meyer, Dolores Lory, MD.    Past Medical History:   Diagnosis Date   Allergic rhinitis    Arthritis    right knee   Complication of anesthesia    a.) Malignant hyperthermia   Diverticulosis of colon    Dysplastic nevus 11/08/2014   L sup med scapula - mild   Hypothyroidism    Malignant hyperthermia     Past Surgical History:  Procedure Laterality Date   ANTERIOR CRUCIATE LIGAMENT REPAIR  [id number]   Left   APPLICATION OF WOUND VAC Left 08/08/2021   Procedure: APPLICATION OF WOUND VAC;  Surgeon: Dereck Leep, MD;  Location: [facility] ORS;  Service: Orthopedics;  Laterality: [id number]   BACK SURGERY  1980   lumbar disk   BALLOON DILATION N/A 04/12/2020   Procedure: BALLOON DILATION;  Surgeon: Abbie Sons, MD;  Location: [facility] ORS;  Service: Urology;  Laterality: N/A;   CYSTOSCOPY WITH URETHRAL DILATATION N/A 04/12/2020   Procedure: CYSTOSCOPY WITH URETHRAL DILATATION;  Surgeon: Abbie Sons, MD;  Location: [facility] ORS;  Service: Urology;  Laterality: N/A;   KNEE ARTHROPLASTY Right 10/21/2019   Procedure: RIGHT COMPUTER ASSISTED TOTAL KNEE ARTHROPLASTY;  Surgeon: Dereck Leep, MD;  Location: [facility] ORS;  Service: Orthopedics;  Laterality: Right;   KNEE ARTHROPLASTY Left 08/08/2021   Procedure: COMPUTER ASSISTED TOTAL KNEE ARTHROPLASTY;  Surgeon: Dereck Leep, MD;  Location: [facility] ORS;  Service: Orthopedics;  Laterality: Left;   KNEE ARTHROSCOPY  1984   Right   LASIK  2001   NASAL SINUS  SURGERY  2010   left side, chronic septal perforation    Social History Social History   Tobacco Use   Smoking status: Never   Smokeless tobacco: Never  Vaping Use   Vaping Use: Never used  Substance Use Topics   Alcohol use: Yes    Comment: Rare   Drug use: No    Family History Family History  Problem Relation Age of Onset   Hypertension Mother    Diabetes Mother    Heart disease Mother    Healthy Father    Diabetes Maternal Aunt    COPD Paternal Uncle    Coronary artery disease Paternal Uncle    Cancer Other        Both  sides in smokers    Allergies  Allergen Reactions   Other Other (See Comments)    Anesthesia gases---malignant hyperthermia   Succinylcholine Other (See Comments)    malignant hyperthermia     REVIEW OF SYSTEMS (Negative unless checked)  Constitutional: []$ Weight loss  []$ Fever  []$ Chills Cardiac: []$ Chest pain   []$ Chest pressure   []$ Palpitations   []$ Shortness of breath when laying flat   []$ Shortness of breath with exertion. Vascular:  []$ Pain in legs with walking   [x]$ Pain in legs at rest  []$ History of DVT   []$ Phlebitis   [x]$ Swelling in legs   []$ Varicose veins   []$ Non-healing ulcers Pulmonary:   []$ Uses home oxygen   []$ Productive cough   []$ Hemoptysis   []$ Wheeze  []$ COPD   []$ Asthma Neurologic:  []$ Dizziness   []$ Seizures   []$ History of stroke   []$ History of TIA  []$ Aphasia   []$ Vissual changes   []$ Weakness or numbness in arm   []$ Weakness or numbness in leg Musculoskeletal:   []$ Joint swelling   []$ Joint pain   []$ Low back pain Hematologic:  []$ Easy bruising  []$ Easy bleeding   []$ Hypercoagulable state   []$ Anemic Gastrointestinal:  []$ Diarrhea   []$ Vomiting  []$ Gastroesophageal reflux/heartburn   []$ Difficulty swallowing. Genitourinary:  []$ Chronic kidney disease   []$ Difficult urination  []$ Frequent urination   []$ Blood in urine Skin:  []$ Rashes   []$ Ulcers  Psychological:  []$ History of anxiety   []$  History of major depression.  Physical Examination  There were no vitals filed for this visit. There is no height or weight on file to calculate BMI. Gen: WD/WN, NAD Head: /AT, No temporalis wasting.  Ear/Nose/Throat: Hearing grossly intact, nares w/o erythema or drainage, pinna without lesions Eyes: PER, EOMI, sclera nonicteric.  Neck: Supple, no gross masses.  No JVD.  Pulmonary:  Good air movement, no audible wheezing, no use of accessory muscles.  Cardiac: RRR, precordium not hyperdynamic. Vascular:  scattered varicosities present bilaterally.  Moderate venous stasis changes to the legs bilaterally.  2+  soft pitting edema  Vessel Right Left  Radial Palpable Palpable  Gastrointestinal: soft, non-distended. No guarding/no peritoneal signs.  Musculoskeletal: M/S 5/5 throughout.  No deformity.  Neurologic: CN 2-12 intact. Pain and light touch intact in extremities.  Symmetrical.  Speech is fluent. Motor exam as listed above. Psychiatric: Judgment intact, Mood & affect appropriate for pt's clinical situation. Dermatologic: Venous rashes no ulcers noted.  No changes consistent with cellulitis. Lymph : No lichenification or skin changes of chronic lymphedema.  CBC Lab Results  Component Value Date   WBC 6.6 10/15/2019   HGB 13.7 10/15/2019   HCT 41.0 10/15/2019   MCV 90.3 10/15/2019   PLT 335 10/15/2019    BMET    Component Value Date/Time   NA 139 10/15/2019 1325  K 3.9 10/15/2019 1325   CL 104 10/15/2019 1325   CO2 28 10/15/2019 1325   GLUCOSE 96 10/15/2019 1325   BUN 18 10/15/2019 1325   CREATININE 1.29 (H) 10/15/2019 1325   CALCIUM 8.8 (L) 10/15/2019 1325   GFRNONAA 59 (L) 10/15/2019 1325   GFRAA >60 10/15/2019 1325   CrCl cannot be calculated (Patient's most recent lab result is older than the maximum 21 days allowed.).  COAG Lab Results  Component Value Date   INR 1.1 08/01/2021   INR 1.1 10/15/2019    Radiology US Venous Img Lower Unilateral Right (DVT)  Result Date: 11/02/2022 CLINICAL DATA:  Right lower extremity pain and edema. Evaluate for DVT. EXAM: RIGHT LOWER EXTREMITY VENOUS DOPPLER ULTRASOUND TECHNIQUE: Gray-scale sonography with graded compression, as well as color Doppler and duplex ultrasound were performed to evaluate the lower extremity deep venous systems from the level of the common femoral vein and including the common femoral, femoral, profunda femoral, popliteal and calf veins including the posterior tibial, peroneal and gastrocnemius veins when visible. The superficial great saphenous vein was also interrogated. Spectral Doppler was utilized to  evaluate flow at rest and with distal augmentation maneuvers in the common femoral, femoral and popliteal veins. COMPARISON:  Right lower extremity venous Doppler ultrasound-10/11/2022 (negative). FINDINGS: Contralateral Common Femoral Vein: Respiratory phasicity is normal and symmetric with the symptomatic side. No evidence of thrombus. Normal compressibility. Common Femoral Vein: No evidence of thrombus. Normal compressibility, respiratory phasicity and response to augmentation. Saphenofemoral Junction: No evidence of thrombus. Normal compressibility and flow on color Doppler imaging. Profunda Femoral Vein: No evidence of thrombus. Normal compressibility and flow on color Doppler imaging. Femoral Vein: No evidence of thrombus. Normal compressibility, respiratory phasicity and response to augmentation. Popliteal Vein: No evidence of thrombus. Normal compressibility, respiratory phasicity and response to augmentation. Calf Veins: No evidence of thrombus. Normal compressibility and flow on color Doppler imaging. Superficial Great Saphenous Vein: No evidence of thrombus. Normal compressibility. Other Findings: Note is made of a mildly prominent though non pathologically enlarged and benign-appearing right inguinal lymph node which is not enlarged by size criteria measuring 0.5 cm in greatest short axis diameter and maintains a benign fatty hila, likely reactive in etiology. IMPRESSION: No evidence of DVT within the right lower extremity. Electronically Signed   By: Sandi Mariscal M.D.   On: 11/02/2022 13:28     Assessment/Plan There are no diagnoses linked to this encounter.   Hortencia Pilar, MD  11/19/2022 9:05 AM

## 2022-11-20 NOTE — Progress Notes (Unsigned)
11/21/2022 2:33 PM   Shawn Phillips [birth year] [id number]  Referring provider: Dion Body, MD [facility] [place],  [place] [address]  Urological history: 1.  Urethral stricture -Balloon dilation of the anterior urethral stricture (2021) -PVR 158 mL -Once to twice a month   2. BPH with LU TS -PSA pending -I PSS 1/0 -PVR 158 mL    Chief Complaint  Patient presents with   Benign Prostatic Hypertrophy    HPI: Shawn Phillips is a 67 y.o. male who presents today for yearly visit.    I PSS 1/0  PVR 158 mL  He has no urinary complaints.  Patient denies any modifying or aggravating factors.  Patient denies any gross hematuria, dysuria or suprapubic/flank pain.  Patient denies any fevers, chills, nausea or vomiting.     IPSS     Row Name 11/21/22 1400         International Prostate Symptom Score   How often have you had the sensation of not emptying your bladder? Not at All     How often have you had to urinate less than every two hours? Not at All     How often have you found you stopped and started again several times when you urinated? Not at All     How often have you found it difficult to postpone urination? Not at All     How often have you had a weak urinary stream? Not at All     How often have you had to strain to start urination? Not at All     How many times did you typically get up at night to urinate? 1 Time     Total IPSS Score 1       Quality of Life due to urinary symptoms   If you were to spend the rest of your life with your urinary condition just the way it is now how would you feel about that? Delighted              Score:  1-7 Mild 8-19 Moderate 20-35 Severe    PMH: Past Medical History:  Diagnosis Date   Allergic rhinitis    Arthritis    right knee   Complication of anesthesia    a.) Malignant hyperthermia   Diverticulosis of colon    Dysplastic nevus 11/08/2014   L sup med scapula -  mild   Hypothyroidism    Malignant hyperthermia     Surgical History: Past Surgical History:  Procedure Laterality Date   ANTERIOR CRUCIATE LIGAMENT REPAIR  [id number]   Left   APPLICATION OF WOUND VAC Left 08/08/2021   Procedure: APPLICATION OF WOUND VAC;  Surgeon: Dereck Leep, MD;  Location: [facility] ORS;  Service: Orthopedics;  Laterality: [id number]   BACK SURGERY  1980   lumbar disk   BALLOON DILATION N/A 04/12/2020   Procedure: BALLOON DILATION;  Surgeon: Abbie Sons, MD;  Location: [facility] ORS;  Service: Urology;  Laterality: N/A;   CYSTOSCOPY WITH URETHRAL DILATATION N/A 04/12/2020   Procedure: CYSTOSCOPY WITH URETHRAL DILATATION;  Surgeon: Abbie Sons, MD;  Location: [facility] OR[facility];  Service: Urology;  Laterality: N/A;   KNEE ARTHROPLASTY Right 10/21/2019   Procedure: RIGHT COMPUTER ASSISTED TOTAL KNEE ARTHROPLASTY;  Surgeon: Dereck Leep, MD;  Location: [facility] OR[facility];  Service: Orthopedics;  Laterality: Right;   KNEE ARTHROPLASTY Left 08/08/2021   Procedure: COMPUTER ASSISTED TOTAL KNEE ARTHROPLASTY;  Surgeon: Skip Estimable  P, MD;  Location: [facility] ORS;  Service: Orthopedics;  Laterality: Left;   KNEE ARTHROSCOPY  1984   Right   LASIK  2001   NASAL SINUS SURGERY  2010   left side, chronic septal perforation    Home Medications:  Allergies as of 11/21/2022       Reactions   Other Other (See Comments)   Anesthesia gases---malignant hyperthermia   Succinylcholine Other (See Comments)   malignant hyperthermia        Medication List        Accurate as of November 21, 2022  2:33 PM. If you have any questions, ask your nurse or doctor.          amoxicillin 500 MG capsule Commonly known as: AMOXIL Take 2,000 mg by mouth See admin instructions. Take 4 capsules (2000 mg) by mouth 1 hour prior to dental appointments.   docusate sodium 100 MG capsule Commonly known as: COLACE Take 200 mg by mouth 2 (two) times daily as needed (CONSTIPATION.).   enoxaparin 40 MG/0.4ML  injection Commonly known as: LOVENOX Inject 0.4 mLs (40 mg total) into the skin daily.   fluticasone 50 MCG/ACT nasal spray Commonly known as: FLONASE Place 2 sprays into the nose daily. What changed: how much to take   loratadine 10 MG tablet Commonly known as: CLARITIN Take 10-20 mg by mouth daily as needed for allergies.   MULTIVITAMIN ADULTS PO Take 1 packet by mouth in the morning. [employer] Vitamin pack   OPTIVE 0.5-0.9 % ophthalmic solution Generic drug: carboxymethylcellul-glycerin Place 1-2 drops into both eyes 2 (two) times daily as needed (dry/irritated eyes.).   oxyCODONE 5 MG immediate release tablet Commonly known as: Roxicodone Take 1-2 tablets (5-10 mg total) by mouth every 4 (four) hours as needed for severe pain.   Synthroid 125 MCG tablet Generic drug: levothyroxine TAKE 1 TABLET BY MOUTH EVERY DAY**NEEDS OFFICE VISIT** What changed: See the new instructions.   traMADol 50 MG tablet Commonly known as: Ultram Take 1 tablet (50 mg total) by mouth every 6 (six) hours as needed for moderate pain.        Allergies:  Allergies  Allergen Reactions   Other Other (See Comments)    Anesthesia gases---malignant hyperthermia   Succinylcholine Other (See Comments)    malignant hyperthermia    Family History: Family History  Problem Relation Age of Onset   Hypertension Mother    Diabetes Mother    Heart disease Mother    Healthy Father    Diabetes Maternal Aunt    COPD Paternal Uncle    Coronary artery disease Paternal Uncle    Cancer Other        Both sides in smokers    Social History:  reports that he has never smoked. He has never used smokeless tobacco. He reports current alcohol use. He reports that he does not use drugs.  ROS: Pertinent ROS in HPI  Physical Exam: BP 120/80   Pulse 72   Ht 5' 6"$  (1.676 m)   Wt 168 lb (76.2 kg)   BMI 27.12 kg/m   Constitutional:  Well nourished. Alert and oriented, No acute distress. HEENT: [place] AT, moist  mucus membranes.  Trachea midline, no masses. Cardiovascular: No clubbing, cyanosis, or edema. Respiratory: Normal respiratory effort, no increased work of breathing. Neurologic: Grossly intact, no focal deficits, moving all 4 extremities. Psychiatric: Normal mood and affect.  Laboratory Data: TSH (08/2022) 9.604 Hemoglobin A1c (07/2022) 5.3 Total Cholesterol (08/2022) 161 Serum creatinine (07/2022)  1.2 I have reviewed the labs.   Pertinent Imaging:  11/21/22 14:09  Scan Result 158   Assessment & Plan:    1. Urethral stricture -Maintaining patency by cathing twice monthly -Will need new prescription  2. BPH with LUTS -PSA pending -PVR < 300 cc  -continue conservative management, avoiding bladder irritants and timed voiding's   Return in about 1 year (around 11/22/2023) for IPSS, PSA and PVR.  These notes generated with voice recognition software. I apologize for typographical errors.  [place], [place] [address]  [place], [place] [address] [phone]

## 2022-11-21 ENCOUNTER — Encounter: Payer: Self-pay | Admitting: Urology

## 2022-11-21 ENCOUNTER — Ambulatory Visit (INDEPENDENT_AMBULATORY_CARE_PROVIDER_SITE_OTHER): Payer: Medicare Other | Admitting: Urology

## 2022-11-21 ENCOUNTER — Encounter (INDEPENDENT_AMBULATORY_CARE_PROVIDER_SITE_OTHER): Payer: Self-pay | Admitting: Vascular Surgery

## 2022-11-21 VITALS — BP 120/80 | HR 72 | Ht 66.0 in | Wt 168.0 lb

## 2022-11-21 DIAGNOSIS — N35914 Unspecified anterior urethral stricture, male: Secondary | ICD-10-CM

## 2022-11-21 DIAGNOSIS — N138 Other obstructive and reflux uropathy: Secondary | ICD-10-CM | POA: Diagnosis not present

## 2022-11-21 DIAGNOSIS — N401 Enlarged prostate with lower urinary tract symptoms: Secondary | ICD-10-CM

## 2022-11-21 DIAGNOSIS — M7989 Other specified soft tissue disorders: Secondary | ICD-10-CM | POA: Insufficient documentation

## 2022-11-21 LAB — BLADDER SCAN AMB NON-IMAGING: Scan Result: 158

## 2022-11-22 LAB — PSA: Prostate Specific Ag, Serum: 1.2 ng/mL (ref 0.0–4.0)

## 2022-12-12 ENCOUNTER — Ambulatory Visit: Payer: Medicare Other | Admitting: Dermatology

## 2023-02-18 ENCOUNTER — Other Ambulatory Visit: Payer: Self-pay

## 2023-02-18 DIAGNOSIS — R030 Elevated blood-pressure reading, without diagnosis of hypertension: Secondary | ICD-10-CM

## 2023-02-18 DIAGNOSIS — Z9189 Other specified personal risk factors, not elsewhere classified: Secondary | ICD-10-CM

## 2023-03-06 ENCOUNTER — Other Ambulatory Visit: Payer: BLUE CROSS/BLUE SHIELD

## 2023-03-12 ENCOUNTER — Encounter: Payer: Self-pay | Admitting: Dermatology

## 2023-03-12 ENCOUNTER — Ambulatory Visit (INDEPENDENT_AMBULATORY_CARE_PROVIDER_SITE_OTHER): Payer: Medicare Other | Admitting: Dermatology

## 2023-03-12 VITALS — BP 151/87 | HR 50

## 2023-03-12 DIAGNOSIS — D1801 Hemangioma of skin and subcutaneous tissue: Secondary | ICD-10-CM

## 2023-03-12 DIAGNOSIS — Z1283 Encounter for screening for malignant neoplasm of skin: Secondary | ICD-10-CM

## 2023-03-12 DIAGNOSIS — X32XXXA Exposure to sunlight, initial encounter: Secondary | ICD-10-CM

## 2023-03-12 DIAGNOSIS — L570 Actinic keratosis: Secondary | ICD-10-CM | POA: Diagnosis not present

## 2023-03-12 DIAGNOSIS — L821 Other seborrheic keratosis: Secondary | ICD-10-CM

## 2023-03-12 DIAGNOSIS — L578 Other skin changes due to chronic exposure to nonionizing radiation: Secondary | ICD-10-CM | POA: Diagnosis not present

## 2023-03-12 DIAGNOSIS — Z79899 Other long term (current) drug therapy: Secondary | ICD-10-CM

## 2023-03-12 DIAGNOSIS — Z5111 Encounter for antineoplastic chemotherapy: Secondary | ICD-10-CM

## 2023-03-12 DIAGNOSIS — Z86018 Personal history of other benign neoplasm: Secondary | ICD-10-CM

## 2023-03-12 DIAGNOSIS — L820 Inflamed seborrheic keratosis: Secondary | ICD-10-CM

## 2023-03-12 DIAGNOSIS — W908XXA Exposure to other nonionizing radiation, initial encounter: Secondary | ICD-10-CM

## 2023-03-12 DIAGNOSIS — Z872 Personal history of diseases of the skin and subcutaneous tissue: Secondary | ICD-10-CM

## 2023-03-12 DIAGNOSIS — D229 Melanocytic nevi, unspecified: Secondary | ICD-10-CM

## 2023-03-12 DIAGNOSIS — Z7189 Other specified counseling: Secondary | ICD-10-CM

## 2023-03-12 DIAGNOSIS — D239 Other benign neoplasm of skin, unspecified: Secondary | ICD-10-CM

## 2023-03-12 DIAGNOSIS — I8393 Asymptomatic varicose veins of bilateral lower extremities: Secondary | ICD-10-CM

## 2023-03-12 DIAGNOSIS — L814 Other melanin hyperpigmentation: Secondary | ICD-10-CM

## 2023-03-12 NOTE — Patient Instructions (Addendum)
Cryotherapy Aftercare  Wash gently with soap and water everyday.   Apply Vaseline Jelly daily until healed.    Start mid July  Apply 5-fluorouracil/calcipotriene cream twice a day for 7 days to affected areas including scalp. Prescription sent to [facility]. Patient advised they will receive an email to purchase the medication online and have it sent to their home. Patient provided with handout reviewing treatment course and side effects and advised to call or message [place] on MyChart with any concerns.   Instructions for [facility] Medications  One or more of your medications was sent to the [facility] mail order [facility] pharmacy. You will receive an email from them and can purchase the medicine through that link. It will then be mailed to your home at the address you confirmed. If for any reason you do not receive an email from them, please check your spam folder. If you still do not find the email, please let us know. [facility] phone number is [phone].   Recommend daily broad spectrum sunscreen SPF 30+ to sun-exposed areas, reapply every 2 hours as needed. Call for new or changing lesions.  Staying in the shade or wearing long sleeves, sun glasses (UVA+UVB protection) and wide brim hats (4-inch brim around the entire circumference of the hat) are also recommended for sun protection.    Melanoma ABCDEs  Melanoma is the most dangerous type of skin cancer, and is the leading cause of death from skin disease.  You are more likely to develop melanoma if you: Have light-colored skin, light-colored eyes, or red or blond hair Spend a lot of time in the sun Tan regularly, either outdoors or in a tanning bed Have had blistering sunburns, especially during childhood Have a close family member who has had a melanoma Have atypical moles or large birthmarks  Early detection of melanoma is key since treatment is typically straightforward and cure rates  are extremely high if we catch it early.   The first sign of melanoma is often a change in a mole or a new dark spot.  The ABCDE system is a way of remembering the signs of melanoma.  A for asymmetry:  The two halves do not match. B for border:  The edges of the growth are irregular. C for color:  A mixture of colors are present instead of an even brown color. D for diameter:  Melanomas are usually (but not always) greater than 6mm - the size of a pencil eraser. E for evolution:  The spot keeps changing in size, shape, and color.  Please check your skin once per month between visits. You can use a small mirror in front and a large mirror behind you to keep an eye on the back side or your body.   If you see any new or changing lesions before your next follow-up, please call to schedule a visit.  Please continue daily skin protection including broad spectrum sunscreen SPF 30+ to sun-exposed areas, reapplying every 2 hours as needed when you're outdoors.   Staying in the shade or wearing long sleeves, sun glasses (UVA+UVB protection) and wide brim hats (4-inch brim around the entire circumference of the hat) are also recommended for sun protection.    Due to recent changes in healthcare laws, you may see results of your pathology and/or laboratory studies on MyChart before the doctors have had a chance to review them. We understand that in some cases there may be results that are confusing or  concerning to you. Please understand that not all results are received at the same time and often the doctors may need to interpret multiple results in order to provide you with the best plan of care or course of treatment. Therefore, we ask that you please give [place] 2 business days to thoroughly review all your results before contacting the office for clarification. Should we see a critical lab result, you will be contacted sooner.   If You Need Anything After Your Visit  If you have any questions or concerns  for your doctor, please call our main line at [phone] and press option 4 to reach your doctor's medical assistant. If no one answers, please leave a voicemail as directed and we will return your call as soon as possible. Messages left after 4 pm will be answered the following business day.   You may also send [place] a message via MyChart. We typically respond to MyChart messages within 1-2 business days.  For prescription refills, please ask your pharmacy to contact our office. Our fax number is [phone].  If you have an urgent issue when the clinic is closed that cannot wait until the next business day, you can page your doctor at the number below.    Please note that while we do our best to be available for urgent issues outside of office hours, we are not available 24/7.   If you have an urgent issue and are unable to reach [place], you may choose to seek medical care at your doctor's office, retail clinic, urgent care center, or emergency room.  If you have a medical emergency, please immediately call 911 or go to the emergency department.  Pager Numbers  - Dr. Gwen Pounds: [phone]  - Dr. Neale Burly: [phone]  - Dr. Roseanne Reno: [phone]  In the event of inclement weather, please call our main line at [phone] for an update on the status of any delays or closures.  Dermatology Medication Tips: Please keep the boxes that topical medications come in in order to help keep track of the instructions about where and how to use these. Pharmacies typically print the medication instructions only on the boxes and not directly on the medication tubes.   If your medication is too expensive, please contact our office at [phone] option 4 or send [place] a message through MyChart.   We are unable to tell what your co-pay for medications will be in advance as this is different depending on your insurance coverage. However, we may be able to find a substitute medication at lower cost or fill out  paperwork to get insurance to cover a needed medication.   If a prior authorization is required to get your medication covered by your insurance company, please allow [place] 1-2 business days to complete this process.  Drug prices often vary depending on where the prescription is filled and some pharmacies may offer cheaper prices.  The website www.goodrx.com contains coupons for medications through different pharmacies. The prices here do not account for what the cost may be with help from insurance (it may be cheaper with your insurance), but the website can give you the price if you did not use any insurance.  - You can print the associated coupon and take it with your prescription to the pharmacy.  - You may also stop by our office during regular business hours and pick up a GoodRx coupon card.  - If you need your prescription sent electronically to a different pharmacy, notify our office through  [place] or by phone at [phone] option 4.     Si Usted Necesita Algo Despus de Su Visita  Tambin puede enviarnos un mensaje a travs de Pharmacist, community. Por lo general respondemos a los mensajes de MyChart en el transcurso de 1 a 2 das hbiles.  Para renovar recetas, por favor pida a su farmacia que se ponga en contacto con nuestra oficina. Harland Dingwall de fax es [place] [phone].  Si tiene un asunto urgente cuando la clnica est cerrada y que no puede esperar hasta el siguiente da hbil, puede llamar/localizar a su doctor(a) al nmero que aparece a continuacin.   Por favor, tenga en cuenta que aunque hacemos todo lo posible para estar disponibles para asuntos urgentes fuera del horario de [place], no estamos disponibles las 24 horas del da, los 7 das de la [place].   Si tiene un problema urgente y no puede comunicarse con nosotros, puede optar por buscar atencin mdica  en el consultorio de su doctor(a), en una clnica privada, en un centro de atencin urgente o en una sala de  emergencias.  Si tiene Engineering geologist, por favor llame inmediatamente al 911 o vaya a la sala de emergencias.  Nmeros de bper  - Dr. Nehemiah Massed: [phone]  - Dra. Moye: [phone]  - Dra. Nicole Kindred: [phone]  En caso de inclemencias del [place], por favor llame a Johnsie Kindred principal al [phone] para una actualizacin sobre el [place] de cualquier retraso o cierre.  Consejos para la medicacin en dermatologa: Por favor, guarde las cajas en las que vienen los medicamentos de uso tpico para ayudarle a seguir las instrucciones sobre dnde y cmo usarlos. Las farmacias generalmente imprimen las instrucciones del medicamento slo en las cajas y no directamente en los tubos del [place].   Si su medicamento es muy caro, por favor, pngase en contacto con Zigmund Daniel llamando al [phone] y presione la opcin 4 o envenos un mensaje a travs de Pharmacist, community.   No podemos decirle cul ser su copago por los medicamentos por adelantado ya que esto es diferente dependiendo de la cobertura de su seguro. Sin embargo, es posible que podamos encontrar un medicamento sustituto a Electrical engineer un formulario para que el seguro cubra el medicamento que se considera necesario.   Si se requiere una autorizacin previa para que su compaa de seguros [place] su medicamento, por favor permtanos de 1 a 2 das hbiles para completar este proceso.  Los precios de los medicamentos varan con frecuencia dependiendo del Environmental consultant de dnde se surte la receta y alguna farmacias pueden ofrecer precios ms baratos.  El sitio web www.goodrx.com tiene cupones para medicamentos de Airline pilot. Los precios aqu no tienen en cuenta lo que podra costar con la ayuda del seguro (puede ser ms barato con su seguro), pero el sitio web puede darle el precio si no utiliz Research scientist (physical sciences).  - Puede imprimir el cupn correspondiente y llevarlo con su receta a la farmacia.  - Tambin puede pasar por  nuestra oficina durante el horario de atencin regular y Charity fundraiser una tarjeta de cupones de GoodRx.  - Si necesita que su receta se enve electrnicamente a una farmacia diferente, informe a nuestra oficina a travs de MyChart de  o por telfono llamando al [phone] y presione la opcin 4.

## 2023-03-12 NOTE — Progress Notes (Signed)
Follow-Up Visit   Subjective  Shawn Phillips is a 67 y.o. male who presents for the following: Skin Cancer Screening and Full Body Skin Exam. Hx of dysplastic nevus. No personal Hx of skin cancer. Hx of Aks. Areas of  concern on scalp and face. Red spot on nose comes and goes for years.   The patient presents for Total-Body Skin Exam (TBSE) for skin cancer screening and mole check. The patient has spots, moles and lesions to be evaluated, some may be new or changing and the patient has concerns that these could be cancer.    The following portions of the chart were reviewed this encounter and updated as appropriate: medications, allergies, medical history  Review of Systems:  No other skin or systemic complaints except as noted in HPI or Assessment and Plan.  Objective  Well appearing patient in no apparent distress; mood and affect are within normal limits.  A full examination was performed including scalp, head, eyes, ears, nose, lips, neck, chest, axillae, abdomen, back, buttocks, bilateral upper extremities, bilateral lower extremities, hands, feet, fingers, toes, fingernails, and toenails. All findings within normal limits unless otherwise noted below.   Relevant physical exam findings are noted in the Assessment and Plan.  mid upper forehead x1, left tricep x1, right lateral knee x1 (3) Erythematous keratotic or waxy stuck-on papule or plaque.  Scalp x16, left ear x1 (17) Erythematous thin papules/macules with gritty scale.     Assessment & Plan   HISTORY OF DYSPLASTIC NEVUS. Left superior medial scapula, mild atypia. 11/08/2014. No evidence of recurrence today Recommend regular full body skin exams Recommend daily broad spectrum sunscreen SPF 30+ to sun-exposed areas, reapply every 2 hours as needed.  Call if any new or changing lesions are noted between office visits   LENTIGINES, SEBORRHEIC KERATOSES, HEMANGIOMAS - Benign normal skin lesions - Benign-appearing - Call  for any changes  MELANOCYTIC NEVI - Tan-brown and/or pink-flesh-colored symmetric macules and papules - Benign appearing on exam today - Observation - Call clinic for new or changing moles - Recommend daily use of broad spectrum spf 30+ sunscreen to sun-exposed areas.   ACTINIC DAMAGE WITH PRECANCEROUS ACTINIC KERATOSES Counseling for Topical Chemotherapy Management: Patient exhibits: - Severe, confluent actinic changes with pre-cancerous actinic keratoses that is secondary to cumulative UV radiation exposure over time - Condition that is severe; chronic, not at goal. - diffuse scaly erythematous macules and papules with underlying dyspigmentation - Discussed Prescription "Field Treatment" topical Chemotherapy for Severe, Chronic Confluent Actinic Changes with Pre-Cancerous Actinic Keratoses Field treatment involves treatment of an entire area of skin that has confluent Actinic Changes (Sun/ Ultraviolet light damage) and PreCancerous Actinic Keratoses by method of PhotoDynamic Therapy (PDT) and/or prescription Topical Chemotherapy agents such as 5-fluorouracil, 5-fluorouracil/calcipotriene, and/or imiquimod.  The purpose is to decrease the number of clinically evident and subclinical PreCancerous lesions to prevent progression to development of skin cancer by chemically destroying early precancer changes that may or may not be visible.  It has been shown to reduce the risk of developing skin cancer in the treated area. As a result of treatment, redness, scaling, crusting, and open sores may occur during treatment course. One or more than one of these methods may be used and may have to be used several times to control, suppress and eliminate the PreCancerous changes. Discussed treatment course, expected reaction, and possible side effects. - Recommend daily broad spectrum sunscreen SPF 30+ to sun-exposed areas, reapply every 2 hours as needed.  -  Staying in the shade or wearing long sleeves, sun  glasses (UVA+UVB protection) and wide brim hats (4-inch brim around the entire circumference of the hat) are also recommended. - Call for new or changing lesions.   Apply 5-fluorouracil/calcipotriene cream twice a day for 7 days to affected areas including scalp. Prescription sent to [facility]. Patient advised they will receive an email to purchase the medication online and have it sent to their home. Patient provided with handout reviewing treatment course and side effects and advised to call or message [place] on MyChart with any concerns.  Start mid July   SKIN CANCER SCREENING PERFORMED TODAY.  Varicose Veins/Spider Veins - Dilated blue, purple or red veins at the lower extremities - Reassured - Smaller vessels can be treated by sclerotherapy (a procedure to inject a medicine into the veins to make them disappear) if desired, but the treatment is not covered by insurance. Larger vessels may be covered if symptomatic and we would refer to vascular surgeon if treatment desired.   DERMATOFIBROMA Exam: Firm pink/brown papulenodule with dimple sign. Treatment Plan: A dermatofibroma is a benign growth possibly related to trauma, such as an insect bite, cut from shaving, or inflamed acne-type bump.  Treatment options to remove include shave or excision with resulting scar and risk of recurrence.  Since benign-appearing and not bothersome, will observe for now.    Inflamed seborrheic keratosis (3) mid upper forehead x1, left tricep x1, right lateral knee x1  Symptomatic, irritating, patient would like treated.  Destruction of lesion - mid upper forehead x1, left tricep x1, right lateral knee x1 Complexity: simple   Destruction method: cryotherapy   Informed consent: discussed and consent obtained   Timeout:  patient name, date of birth, surgical site, and procedure verified Lesion destroyed using liquid nitrogen: Yes   Region frozen until ice ball extended beyond  lesion: Yes   Outcome: patient tolerated procedure well with no complications   Post-procedure details: wound care instructions given   Additional details:  Prior to procedure, discussed risks of blister formation, small wound, skin dyspigmentation, or rare scar following cryotherapy. Recommend Vaseline ointment to treated areas while healing.   AK (actinic keratosis) (17) Scalp x16, left ear x1  Actinic keratoses are precancerous spots that appear secondary to cumulative UV radiation exposure/sun exposure over time. They are chronic with expected duration over 1 year. A portion of actinic keratoses will progress to squamous cell carcinoma of the skin. It is not possible to reliably predict which spots will progress to skin cancer and so treatment is recommended to prevent development of skin cancer.  Recommend daily broad spectrum sunscreen SPF 30+ to sun-exposed areas, reapply every 2 hours as needed.  Recommend staying in the shade or wearing long sleeves, sun glasses (UVA+UVB protection) and wide brim hats (4-inch brim around the entire circumference of the hat). Call for new or changing lesions.  Destruction of lesion - Scalp x16, left ear x1 Complexity: simple   Destruction method: cryotherapy   Informed consent: discussed and consent obtained   Timeout:  patient name, date of birth, surgical site, and procedure verified Lesion destroyed using liquid nitrogen: Yes   Region frozen until ice ball extended beyond lesion: Yes   Outcome: patient tolerated procedure well with no complications   Post-procedure details: wound care instructions given   Additional details:  Prior to procedure, discussed risks of blister formation, small wound, skin dyspigmentation, or rare scar following cryotherapy. Recommend Vaseline ointment to treated areas  while healing.    Return in about 1 year (around 03/11/2024) for TBSE.  I, Lawson Radar, CMA, am acting as scribe for Armida Sans,  MD.   Documentation: I have reviewed the above documentation for accuracy and completeness, and I agree with the above.  Armida Sans, MD

## 2023-03-15 ENCOUNTER — Encounter: Payer: Self-pay | Admitting: Dermatology

## 2023-04-26 ENCOUNTER — Other Ambulatory Visit: Payer: Self-pay

## 2023-04-29 ENCOUNTER — Other Ambulatory Visit: Payer: Self-pay

## 2023-04-29 MED ORDER — LEVOTHYROXINE SODIUM 125 MCG PO TABS
125.0000 ug | ORAL_TABLET | Freq: Every day | ORAL | 1 refills | Status: DC
  Filled 2023-04-29: qty 90, 90d supply, fill #0

## 2023-05-13 IMAGING — MR MR LUMBAR SPINE WO/W CM
4 of 7 series · 25 of 48 positions shown · IV contrast (multihance)
Comparison: MRI of the lumbar spine February 05, 2021

CLINICAL DATA: Herniated lumbar disc without myelopathy
([id number]).

EXAM:
MRI LUMBAR SPINE WITHOUT AND WITH CONTRAST
TECHNIQUE: Multiplanar and multiecho pulse sequences of the lumbar spine were
obtained without and with intravenous contrast.
CONTRAST:  15mL MULTIHANCE GADOBENATE DIMEGLUMINE 529 MG/ML IV SOLN

[Series 6: T1 · sagittal · 4.0mm · 0.53mm/px · 5 of 15 slices shown (1 of 2)]
[im 1/15]
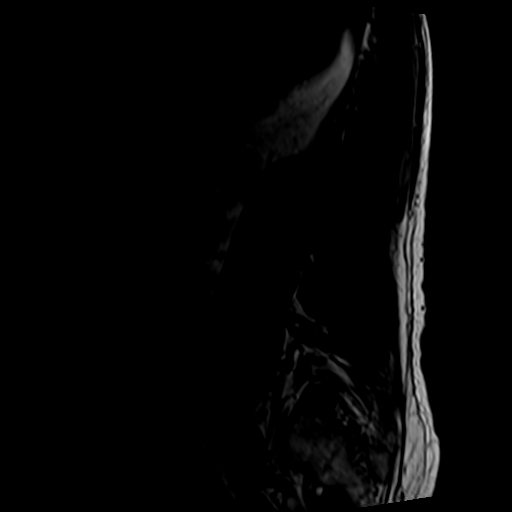
[im 4/15]
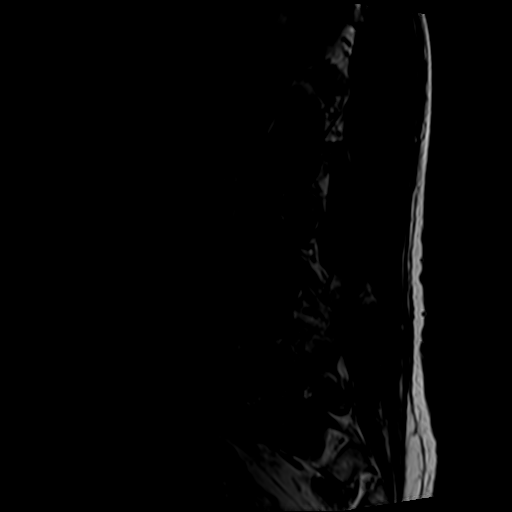
[im 8/15]
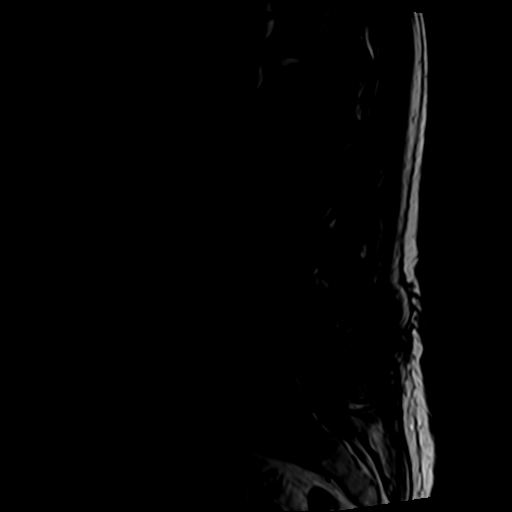
[im 11/15]
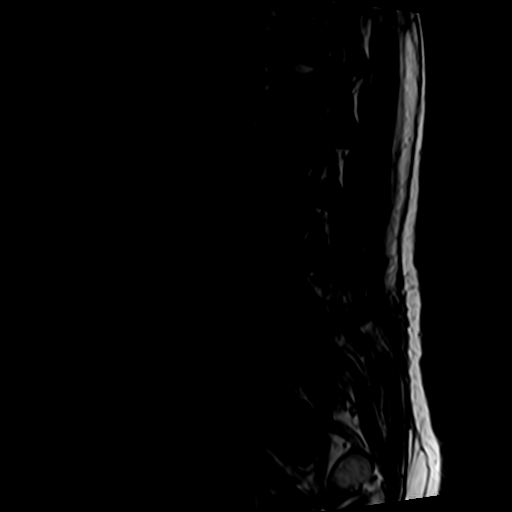
[im 15/15]
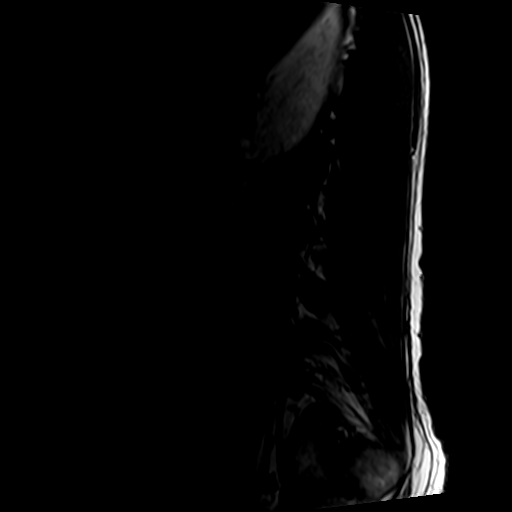

[Series 7: T2 · axial · 4.0mm · 0.70mm/px · z∈[-60,+147]mm · 8 of 36 slices shown (1 of 2)]
[im 1/36]
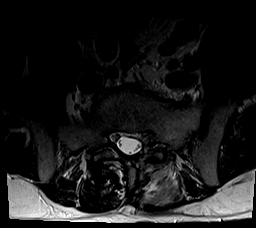
[im 4/36]
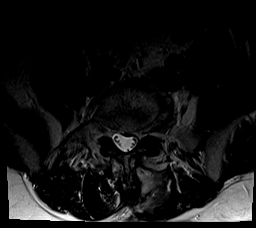
[im 12/36]
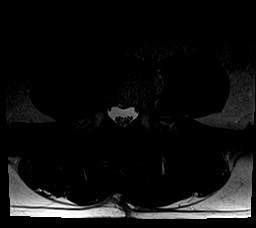
[im 16/36]
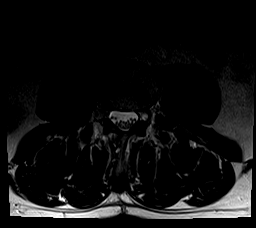
[im 20/36]
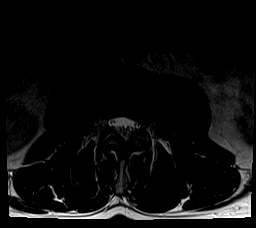
[im 24/36]
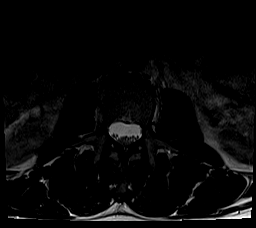
[im 32/36]
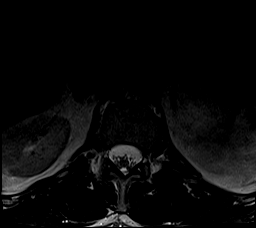
[im 36/36]
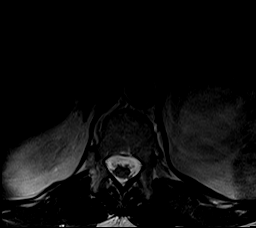

[Series 8: T1 · axial · 4.0mm · 0.35mm/px · z∈[-60,+147]mm · 8 of 36 slices shown (2 of 2)]
[im 1/36]
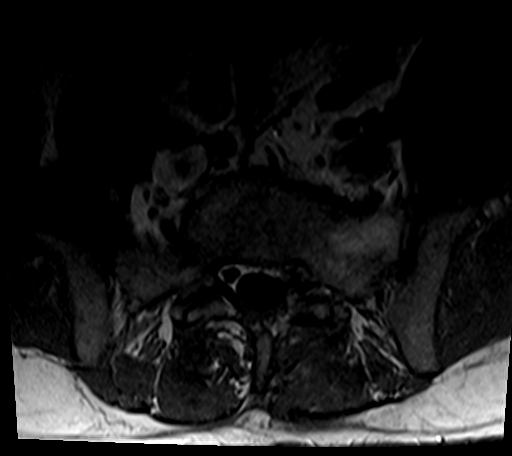
[im 4/36]
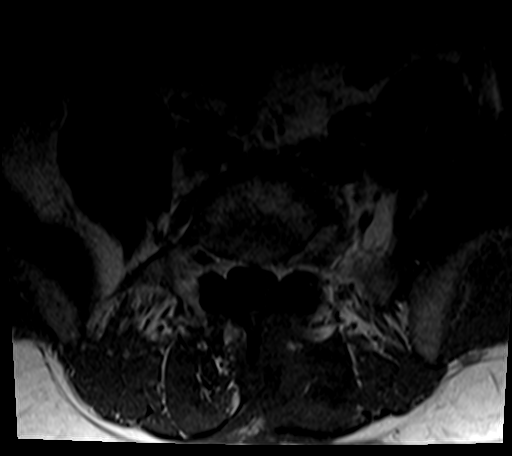
[im 12/36]
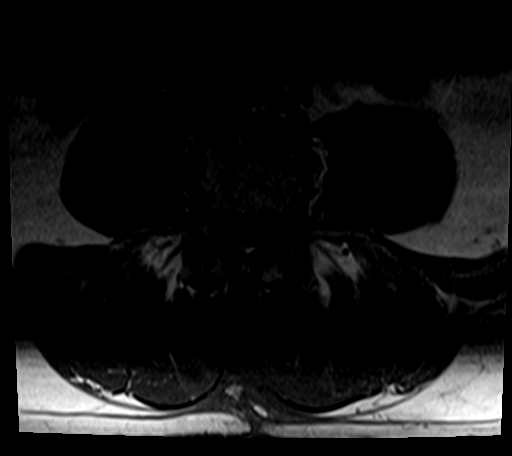
[im 16/36]
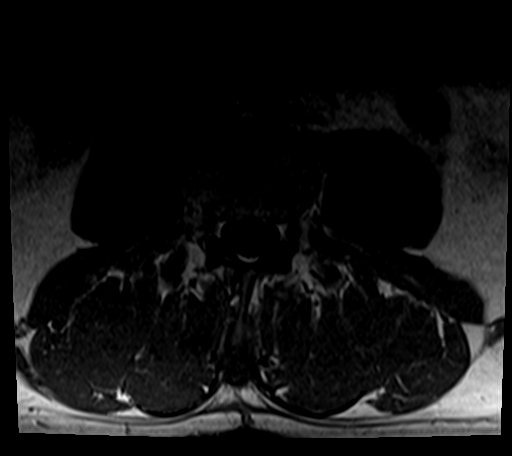
[im 20/36]
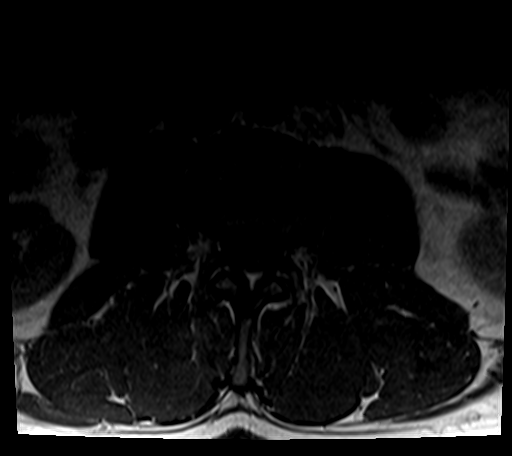
[im 24/36]
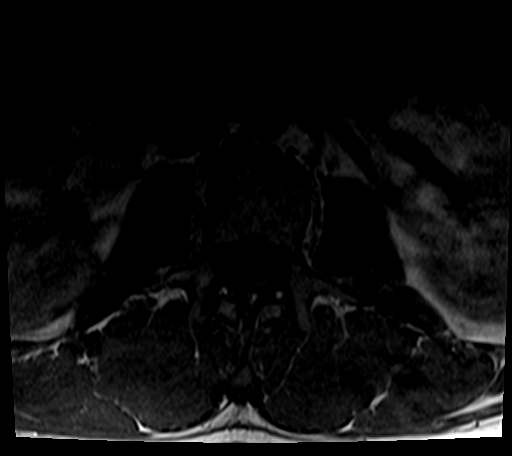
[im 32/36]
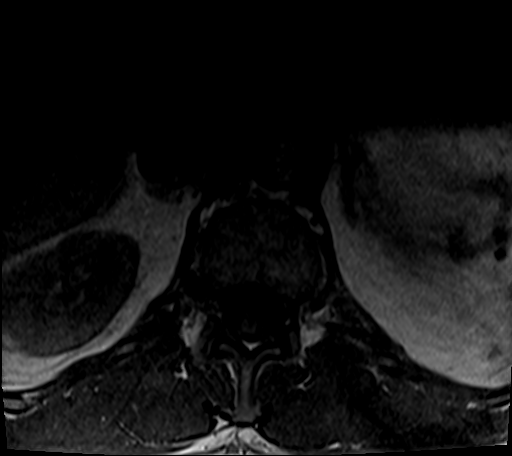
[im 36/36]
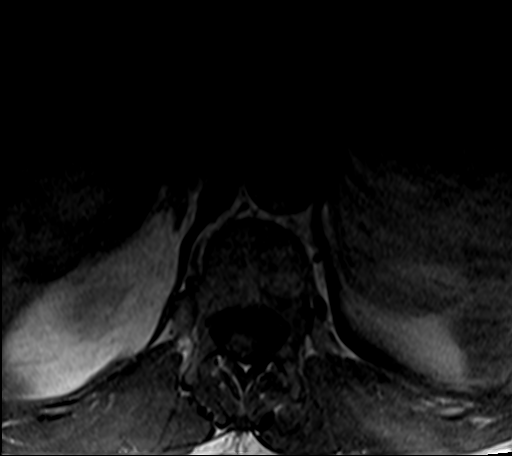

[Series 9: T2 · sagittal · 4.0mm · 0.53mm/px · 4 of 15 slices shown (2 of 2)]
[im 1/15]
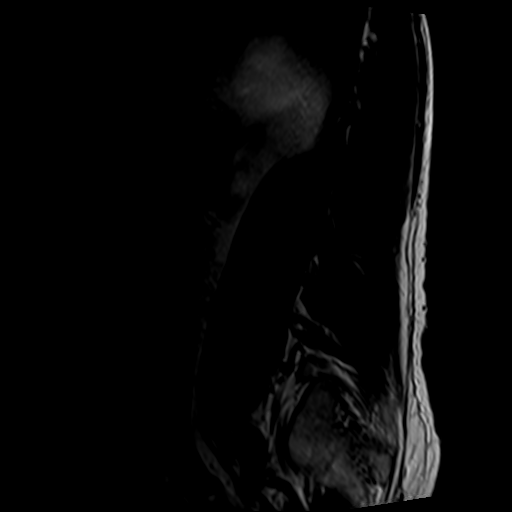
[im 5/15]
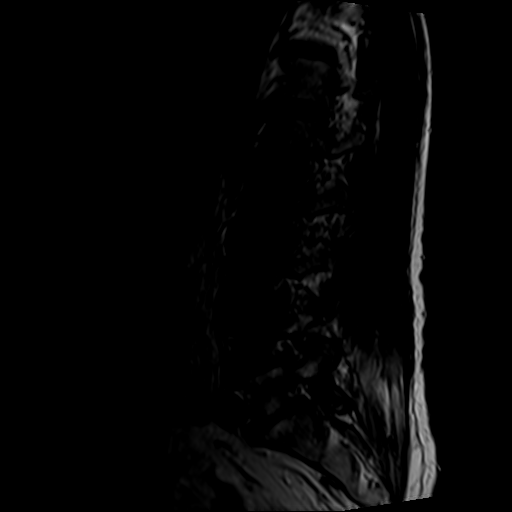
[im 10/15]
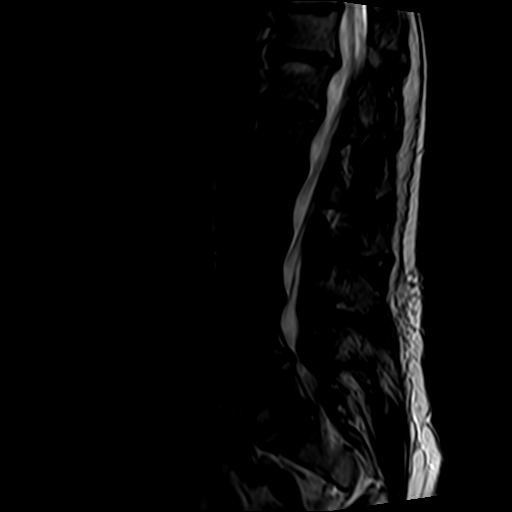
[im 15/15]
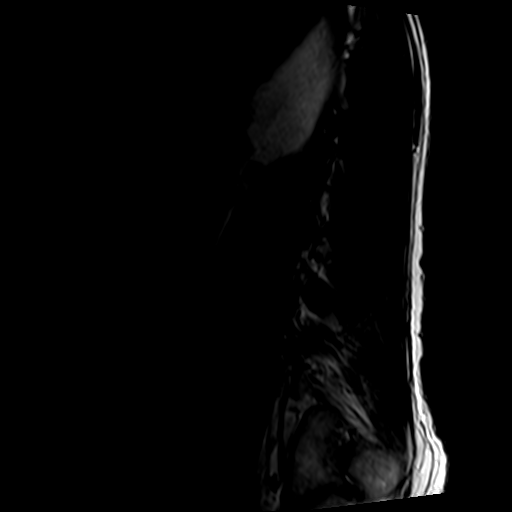

[25 of 48 positions shown; findings below may reference images not displayed]

FINDINGS: Segmentation:  Standard.

Alignment: Small anterolisthesis of L4 over L5. Small retrolisthesis
of L5 over S1.

Vertebrae: No fracture, evidence of discitis, or bone lesion.
Postsurgical changes from L4-5 left-sided laminectomy and micro
discectomy.

Conus medullaris and cauda equina: Conus extends to the L1 level.
Conus and cauda equina appear normal.

Paraspinal and other soft tissues: Postsurgical edema and contrast
enhancement of the paraspinal soft tissues at L4-5.

Disc levels:

T11-12: Only seen on sagittal images. Loss of disc height, disc
bulge causing indentation of the thecal sac without significant
spinal canal or neural foraminal stenosis.

T12-L1: Shallow disc bulge without significant spinal canal or
neural foraminal stenosis.

L1-2: No significant spinal canal or neural foraminal stenosis.

L2-3: Shallow disc bulge and mild facet degenerative changes. No
significant spinal canal neural foraminal stenosis.

L3-4: Shallow disc bulge and mild facet degenerative changes without
significant spinal canal or neural foraminal stenosis.

L4-5: Postsurgical changes with resolution of the left central disc
extrusion with tiny residual central disc protrusion, hypertrophic
facet degenerative changes with bilateral joint effusion and
right-sided ligamentum flavum redundancy with residual mild spinal
canal stenosis which is significantly improved from prior (severe
stenosis prior). Moderate bilateral neural foraminal narrowing,
unchanged. T1 hypointense, enhancing tissue in the posterior
epidural space and left subarticular zone is consistent with
postsurgical granulation tissue.

L5-S1: Loss of disc height, disc bulge with right foraminal/far
lateral osteophytic component and mild facet degenerative changes
resulting in moderate left neural foraminal narrowing, unchanged. No
significant spinal canal stenosis.
IMPRESSION: 1. Postsurgical changes from left laminectomy and micro discectomy
at L4-5 with significant interval improvement of the spinal canal
stenosis at this level.
2. Residual moderate bilateral neural foraminal narrowing at L4-5.
3. Moderate left neural foraminal narrowing at L5-S1.

## 2023-07-01 ENCOUNTER — Other Ambulatory Visit: Payer: Self-pay | Admitting: Family Medicine

## 2023-07-01 ENCOUNTER — Other Ambulatory Visit: Payer: Self-pay

## 2023-07-01 DIAGNOSIS — R030 Elevated blood-pressure reading, without diagnosis of hypertension: Secondary | ICD-10-CM

## 2023-07-01 DIAGNOSIS — Z9189 Other specified personal risk factors, not elsewhere classified: Secondary | ICD-10-CM

## 2023-07-01 MED ORDER — SYNTHROID 125 MCG PO TABS
125.0000 ug | ORAL_TABLET | Freq: Every day | ORAL | 1 refills | Status: DC
  Filled 2023-07-01 (×2): qty 90, 90d supply, fill #0
  Filled 2023-09-25: qty 90, 90d supply, fill #1
  Filled 2023-12-25: qty 20, 20d supply, fill #2

## 2023-07-02 ENCOUNTER — Other Ambulatory Visit: Payer: Self-pay

## 2023-07-10 ENCOUNTER — Other Ambulatory Visit: Payer: Self-pay

## 2023-07-10 ENCOUNTER — Ambulatory Visit
Admission: RE | Admit: 2023-07-10 | Discharge: 2023-07-10 | Disposition: A | Discharge status: Home / Self Care | Payer: BLUE CROSS/BLUE SHIELD | Source: Ambulatory Visit | Attending: Family Medicine | Admitting: Family Medicine

## 2023-07-10 DIAGNOSIS — R030 Elevated blood-pressure reading, without diagnosis of hypertension: Secondary | ICD-10-CM | POA: Insufficient documentation

## 2023-07-10 DIAGNOSIS — Z9189 Other specified personal risk factors, not elsewhere classified: Secondary | ICD-10-CM | POA: Insufficient documentation

## 2023-07-10 MED ORDER — GOLYTELY 236 G PO SOLR
ORAL | 0 refills | Status: DC
  Filled 2023-07-10 – 2023-11-04 (×2): qty 4000, 1d supply, fill #0

## 2023-07-16 ENCOUNTER — Other Ambulatory Visit: Payer: Self-pay

## 2023-07-22 ENCOUNTER — Other Ambulatory Visit: Payer: Self-pay

## 2023-07-26 ENCOUNTER — Other Ambulatory Visit: Payer: Self-pay

## 2023-09-03 ENCOUNTER — Other Ambulatory Visit: Payer: Self-pay

## 2023-09-03 MED ORDER — NAPROXEN 500 MG PO TABS
500.0000 mg | ORAL_TABLET | Freq: Two times a day (BID) | ORAL | 1 refills | Status: DC
  Filled 2023-09-03: qty 60, 30d supply, fill #0

## 2023-09-03 MED ORDER — CYCLOBENZAPRINE HCL 10 MG PO TABS
10.0000 mg | ORAL_TABLET | Freq: Four times a day (QID) | ORAL | 0 refills | Status: DC | PRN
  Filled 2023-09-03: qty 30, 8d supply, fill #0

## 2023-09-03 MED ORDER — ONDANSETRON HCL 4 MG PO TABS
4.0000 mg | ORAL_TABLET | Freq: Four times a day (QID) | ORAL | 0 refills | Status: DC | PRN
  Filled 2023-09-03: qty 10, 3d supply, fill #0

## 2023-09-03 MED ORDER — OXYCODONE-ACETAMINOPHEN 5-325 MG PO TABS
1.0000 | ORAL_TABLET | ORAL | 0 refills | Status: DC | PRN
  Filled 2023-09-03: qty 20, 4d supply, fill #0

## 2023-09-26 ENCOUNTER — Other Ambulatory Visit: Payer: Self-pay

## 2023-09-27 ENCOUNTER — Other Ambulatory Visit: Payer: Self-pay

## 2023-11-04 ENCOUNTER — Other Ambulatory Visit: Payer: Self-pay

## 2023-11-20 ENCOUNTER — Ambulatory Visit: Payer: Self-pay | Admitting: Urology

## 2023-11-25 NOTE — Progress Notes (Unsigned)
 11/26/2023 4:12 PM   [name] 1956-07-23 [id number]  Referring provider: Marisue Ivan, MD [phone] [facility] [address] [facility] [place],  [place] [address]  Urological history: 1. Urethral stricture -Balloon dilation of the anterior urethral stricture (2021) -managed with once to twice a month self cath  2. BPH with LU TS -PSA pending  No chief complaint on file.  HPI: Shawn Phillips is a 68 y.o. male who presents today for yearly visit.    Previous records reviewed.     I PSS ***  PVR *** mL      Score:  1-7 Mild 8-19 Moderate 20-35 Severe    PMH: Past Medical History:  Diagnosis Date   Allergic rhinitis    Arthritis    right knee   Complication of anesthesia    a.) Malignant hyperthermia   Diverticulosis of colon    Dysplastic nevus 11/08/2014   L sup med scapula - mild   Hypothyroidism    Malignant hyperthermia     Surgical History: Past Surgical History:  Procedure Laterality Date   ANTERIOR CRUCIATE LIGAMENT REPAIR  1994   Left   APPLICATION OF WOUND VAC Left 08/08/2021   Procedure: APPLICATION OF WOUND VAC;  Surgeon: Donato Heinz, MD;  Location: [facility];  Service: Orthopedics;  Laterality: Left;  [id number]   BACK SURGERY  1980   lumbar disk   BALLOON DILATION N/A 04/12/2020   Procedure: BALLOON DILATION;  Surgeon: Riki Altes, MD;  Location: [facility];  Service: Urology;  Laterality: N/A;   CYSTOSCOPY WITH URETHRAL DILATATION N/A 04/12/2020   Procedure: CYSTOSCOPY WITH URETHRAL DILATATION;  Surgeon: Riki Altes, MD;  Location: [facility];  Service: Urology;  Laterality: N/A;   KNEE ARTHROPLASTY Right 10/21/2019   Procedure: RIGHT COMPUTER ASSISTED TOTAL KNEE ARTHROPLASTY;  Surgeon: Donato Heinz, MD;  Location: [facility];  Service: Orthopedics;  Laterality: Right;   KNEE ARTHROPLASTY Left 08/08/2021   Procedure: COMPUTER ASSISTED TOTAL KNEE ARTHROPLASTY;  Surgeon: Donato Heinz, MD;  Location: [facility];   Service: Orthopedics;  Laterality: Left;   KNEE ARTHROSCOPY  1984   Right   LASIK  2001   NASAL SINUS SURGERY  2010   left side, chronic septal perforation    Home Medications:  Allergies as of 11/26/2023       Reactions   Other Other (See Comments)   Anesthesia gases---malignant hyperthermia   Succinylcholine Other (See Comments)   malignant hyperthermia        Medication List        Accurate as of November 25, 2023  4:12 PM. If you have any questions, ask your nurse or doctor.          amoxicillin 500 MG capsule Commonly known as: AMOXIL Take 2,000 mg by mouth See admin instructions. Take 4 capsules (2000 mg) by mouth 1 hour prior to dental appointments.   cyclobenzaprine 10 MG tablet Commonly known as: FLEXERIL Take 1 tablet (10 mg total) by mouth every 6 (six) to 8 (eight) hours as needed for spasm   docusate sodium 100 MG capsule Commonly known as: COLACE Take 200 mg by mouth 2 (two) times daily as needed (CONSTIPATION.).   enoxaparin 40 MG/0.4ML injection Commonly known as: LOVENOX Inject 0.4 mLs (40 mg total) into the skin daily.   fluticasone 50 MCG/ACT nasal spray Commonly known as: FLONASE Place 2 sprays into the nose daily. What changed: how much to take   loratadine 10 MG  tablet Commonly known as: CLARITIN Take 10-20 mg by mouth daily as needed for allergies.   MULTIVITAMIN ADULTS PO Take 1 packet by mouth in the morning. [employer] Vitamin pack   ondansetron 4 MG tablet Commonly known as: Zofran Take 1 tablet (4 mg total) by mouth every 6 (six) to 8 (eight) hours as needed for post-op nausea   OPTIVE 0.5-0.9 % ophthalmic solution Generic drug: carboxymethylcellul-glycerin Place 1-2 drops into both eyes 2 (two) times daily as needed (dry/irritated eyes.).   oxyCODONE 5 MG immediate release tablet Commonly known as: Roxicodone Take 1-2 tablets (5-10 mg total) by mouth every 4 (four) hours as needed for severe pain.   oxyCODONE-acetaminophen  5-325 MG tablet Commonly known as: Percocet Take 1 tablet by mouth every 4 (four) to 6 (six) hours as needed for pain. Do not exceed 6 tablets per day.   PEG-3350/Electrolytes 236 g Solr Take as directed   Synthroid 125 MCG tablet Generic drug: levothyroxine TAKE 1 TABLET BY MOUTH EVERY DAY**NEEDS OFFICE VISIT** What changed: See the new instructions.   Synthroid 125 MCG tablet Generic drug: levothyroxine Take 1 tablet (125 mcg total) by mouth daily. Take on an empty stomach with a glass of water at least 30-60 minutes before breakfast. What changed: Another medication with the same name was changed. Make sure you understand how and when to take each.   traMADol 50 MG tablet Commonly known as: Ultram Take 1 tablet (50 mg total) by mouth every 6 (six) hours as needed for moderate pain.        Allergies:  Allergies  Allergen Reactions   Other Other (See Comments)    Anesthesia gases---malignant hyperthermia   Succinylcholine Other (See Comments)    malignant hyperthermia    Family History: Family History  Problem Relation Age of Onset   Hypertension Mother    Diabetes Mother    Heart disease Mother    Healthy Father    Diabetes Maternal Aunt    COPD Paternal Uncle    Coronary artery disease Paternal Uncle    Cancer Other        Both sides in smokers    Social History:  reports that he has never smoked. He has never used smokeless tobacco. He reports current alcohol use. He reports that he does not use drugs.  ROS: Pertinent ROS in HPI  Physical Exam: There were no vitals taken for this visit.  Constitutional:  Well nourished. Alert and oriented, No acute distress. HEENT: [place] AT, moist mucus membranes.  Trachea midline, no masses. Cardiovascular: No clubbing, cyanosis, or edema. Respiratory: Normal respiratory effort, no increased work of breathing. GI: Abdomen is soft, non tender, non distended, no abdominal masses. Liver and spleen not palpable.  No hernias  appreciated.  Stool sample for occult testing is not indicated.   GU: No CVA tenderness.  No bladder fullness or masses.  Patient with circumcised/uncircumcised phallus. ***Foreskin easily retracted***  Urethral meatus is patent.  No penile discharge. No penile lesions or rashes. Scrotum without lesions, cysts, rashes and/or edema.  Testicles are located scrotally bilaterally. No masses are appreciated in the testicles. Left and right epididymis are normal. Rectal: Patient with  normal sphincter tone. Anus and perineum without scarring or rashes. No rectal masses are appreciated. Prostate is approximately *** grams, *** nodules are appreciated. Seminal vesicles are normal. Skin: No rashes, bruises or suspicious lesions. Lymph: No cervical or inguinal adenopathy. Neurologic: Grossly intact, no focal deficits, moving all 4 extremities. Psychiatric: Normal mood  and affect.   Laboratory Data: Thyroid Stimulating-Hormone (TSH) Order: [id number] Component Ref Range & Units 1 mo ago  Thyroid Stimulating Hormone (TSH) 0.450-5.330 uIU/ml uIU/mL 1.384  Resulting Agency [facility] - LAB   Specimen Collected: 10/01/23 14:12   Performed by: [facility] Last Resulted: 10/01/23 16:46  Received From: [facility]  Result Received: 11/25/23 16:13   Comprehensive Metabolic Panel (CMP) Order: [id number] Component Ref Range & Units 2 mo ago  Glucose 70 - 110 mg/dL 95  Sodium 782 - 956 mmol/L 139  Potassium 3.6 - 5.1 mmol/L 4.6  Chloride 97 - 109 mmol/L 105  Carbon Dioxide (CO2) 22.0 - 32.0 mmol/L 30  Urea Nitrogen (BUN) 7 - 25 mg/dL 21  Creatinine 0.7 - 1.3 mg/dL 1  Glomerular Filtration Rate (eGFR) >60 mL/min/1.73sq m 82  Comment: CKD-EPI (2021) does not include patient's race in the calculation of eGFR.  Monitoring changes of plasma creatinine and eGFR over time is useful for monitoring kidney function.  Interpretive Ranges for eGFR (CKD-EPI  2021):  eGFR:       >60 mL/min/1.73 sq. m - Normal eGFR:       30-59 mL/min/1.73 sq. m - Moderately Decreased eGFR:       15-29 mL/min/1.73 sq. m  - Severely Decreased eGFR:       < 15 mL/min/1.73 sq. m  - Kidney Failure   Note: These eGFR calculations do not apply in acute situations when eGFR is changing rapidly or patients on dialysis.  Calcium 8.7 - 10.3 mg/dL 9.1  AST 8 - 39 U/L 21  ALT 6 - 57 U/L 19  Alk Phos (alkaline Phosphatase) 34 - 104 U/L 96  Albumin 3.5 - 4.8 g/dL 4.2  Bilirubin, Total 0.3 - 1.2 mg/dL 0.5  Protein, Total 6.1 - 7.9 g/dL 6.1  A/G Ratio 1.0 - 5.0 gm/dL 2.2  Resulting Agency [facility]   Specimen Collected: 09/20/23 08:35   Performed by: [facility] Last Resulted: 09/20/23 14:11  Received From: [facility]  Result Received: 11/25/23 16:13    Lipid Panel w/calc LDL Order: [id number] Component Ref Range & Units 2 mo ago  Cholesterol, Total 100 - 200 mg/dL 469  Triglyceride 35 - 199 mg/dL 62  HDL (High Density Lipoprotein) Cholesterol 29.0 - 71.0 mg/dL 62.9  LDL Calculated 0 - 130 mg/dL 94  VLDL Cholesterol mg/dL 12  Cholesterol/HDL Ratio 3.3  Resulting Agency [facility]   Specimen Collected: 09/20/23 08:35   Performed by: [facility] Last Resulted: 09/20/23 14:13  Received From: [facility]  Result Received: 11/25/23 16:13   CBC w/auto Differential (5 Part) Order: [id number] Component Ref Range & Units 2 mo ago  WBC (White Blood Cell Count) 4.1 - 10.2 10^3/uL 5.1  RBC (Red Blood Cell Count) 4.69 - 6.13 10^6/uL 4.36 Low   Hemoglobin 14.1 - 18.1 gm/dL 01.0  Hematocrit 27.2 - 52.0 % 41.6  MCV (Mean Corpuscular Volume) 80.0 - 100.0 fl 95.4  MCH (Mean Corpuscular Hemoglobin) 27.0 - 31.2 pg 33 High   MCHC (Mean Corpuscular Hemoglobin Concentration) 32.0 - 36.0 gm/dL 53.6  Platelet Count 644 - 450 10^3/uL 348  RDW-CV (Red Cell  Distribution Width) 11.6 - 14.8 % 12.7  MPV (Mean Platelet Volume) 9.4 - 12.4 fl 9.2 Low   Neutrophils 1.50 - 7.80 10^3/uL 2.36  Lymphocytes 1.00 - 3.60 10^3/uL 1.81  Monocytes 0.00 -  1.50 10^3/uL 0.35  Eosinophils 0.00 - 0.55 10^3/uL 0.52  Basophils 0.00 - 0.09 10^3/uL 0.06  Neutrophil % 32.0 - 70.0 % 46.2  Lymphocyte % 10.0 - 50.0 % 35.4  Monocyte % 4.0 - 13.0 % 6.8  Eosinophil % 1.0 - 5.0 % 10.2 High   Basophil% 0.0 - 2.0 % 1.2  Immature Granulocyte % <=0.7 % 0.2  Immature Granulocyte Count <=0.06 10^3/L 0.01  Resulting Agency [facility]   Specimen Collected: 09/20/23 08:35   Performed by: [facility] Last Resulted: 09/20/23 09:35  Received From: [facility]  Result Received: 11/25/23 16:13  I have reviewed the labs.   Pertinent Imaging: ***  Assessment & Plan:    1. Urethral stricture -Maintaining patency by cathing twice monthly -Will need new prescription  2. BPH with LUTS -PSA pending -PVR < 300 cc  -continue conservative management, avoiding bladder irritants and timed voiding's   No follow-ups on file.  These notes generated with voice recognition software. I apologize for typographical errors.  Cloretta Ned  [facility] [address]  [address] [place], [place] [address] [phone]

## 2023-11-26 ENCOUNTER — Encounter: Payer: Self-pay | Admitting: Urology

## 2023-11-26 ENCOUNTER — Ambulatory Visit: Payer: Medicare Other | Admitting: Urology

## 2023-11-26 ENCOUNTER — Other Ambulatory Visit: Payer: Self-pay

## 2023-11-26 VITALS — BP 179/82 | HR 61 | Ht 67.0 in | Wt 160.0 lb

## 2023-11-26 DIAGNOSIS — N401 Enlarged prostate with lower urinary tract symptoms: Secondary | ICD-10-CM

## 2023-11-26 DIAGNOSIS — N5089 Other specified disorders of the male genital organs: Secondary | ICD-10-CM

## 2023-11-26 DIAGNOSIS — N433 Hydrocele, unspecified: Secondary | ICD-10-CM

## 2023-11-26 DIAGNOSIS — R21 Rash and other nonspecific skin eruption: Secondary | ICD-10-CM

## 2023-11-26 DIAGNOSIS — N35914 Unspecified anterior urethral stricture, male: Secondary | ICD-10-CM | POA: Diagnosis not present

## 2023-11-26 LAB — BLADDER SCAN AMB NON-IMAGING: Scan Result: 0

## 2023-11-26 MED ORDER — NYSTATIN-TRIAMCINOLONE 100000-0.1 UNIT/GM-% EX OINT
1.0000 | TOPICAL_OINTMENT | Freq: Two times a day (BID) | CUTANEOUS | 0 refills | Status: AC
  Filled 2023-11-26: qty 30, 30d supply, fill #0

## 2023-11-27 LAB — PSA: Prostate Specific Ag, Serum: 1.2 ng/mL (ref 0.0–4.0)

## 2023-11-29 ENCOUNTER — Ambulatory Visit
Admission: RE | Admit: 2023-11-29 | Discharge: 2023-11-29 | Disposition: A | Discharge status: Home / Self Care | Payer: Medicare Other | Source: Ambulatory Visit | Attending: Urology | Admitting: Urology

## 2023-11-29 DIAGNOSIS — N433 Hydrocele, unspecified: Secondary | ICD-10-CM | POA: Insufficient documentation

## 2023-12-09 NOTE — Progress Notes (Unsigned)
 12/11/2023 9:39 PM   Shawn Phillips [birth year] [id number]  Referring provider: Marisue Ivan, MD [phone] [facility] [address] [facility] [place],  [place] [address]  Urological history: 1. Urethral stricture -Balloon dilation of the anterior urethral stricture (2021) -managed with once to twice a month self cath  2. BPH with LU TS -PSA pending  No chief complaint on file.  HPI: Shawn Phillips is a 68 y.o. male who presents today for yearly visit.    Previous records reviewed.     At his visit on 11/26/2023, he feels that his left scrotum is getting larger.  He denied any pain.  He is unsure if the scrotal contacts have changed.  He is also noticed a rash on his scrotal skin in the left hemiscrotum.  He states it started out about the size of a dime but now it has increased to a larger area.  It does cause itching.  He is self cathing once or twice a month.  I PSS 2/0.  PVR 0 mL.  He has no urinary complaints today.  Patient denies any modifying or aggravating factors.  Patient denies any recent UTI's, gross hematuria, dysuria or suprapubic/flank pain.  Patient denies any fevers, chills, nausea or vomiting.   He was prescribed Mycolog cream twice daily and a scrotal ultrasound was ordered.    Scrotal US (11/29/2023) - multiloculated cysts in the epididymal heads bilaterally, likely spermatoceles.  Multiple left testicular cysts.    PMH: Past Medical History:  Diagnosis Date   Allergic rhinitis    Arthritis    right knee   Complication of anesthesia    a.) Malignant hyperthermia   Diverticulosis of colon    Dysplastic nevus 11/08/2014   L sup med scapula - mild   Hypothyroidism    Malignant hyperthermia     Surgical History: Past Surgical History:  Procedure Laterality Date   ANTERIOR CRUCIATE LIGAMENT REPAIR  1994   Left   APPLICATION OF WOUND VAC Left 08/08/2021   Procedure: APPLICATION OF WOUND VAC;  Surgeon: Donato Heinz, MD;  Location: [facility];   Service: Orthopedics;  Laterality: Left;  [id number]   BACK SURGERY  1980   lumbar disk   BALLOON DILATION N/A 04/12/2020   Procedure: BALLOON DILATION;  Surgeon: Riki Altes, MD;  Location: [facility];  Service: Urology;  Laterality: N/A;   CYSTOSCOPY WITH URETHRAL DILATATION N/A 04/12/2020   Procedure: CYSTOSCOPY WITH URETHRAL DILATATION;  Surgeon: Riki Altes, MD;  Location: [facility];  Service: Urology;  Laterality: N/A;   KNEE ARTHROPLASTY Right 10/21/2019   Procedure: RIGHT COMPUTER ASSISTED TOTAL KNEE ARTHROPLASTY;  Surgeon: Donato Heinz, MD;  Location: [facility];  Service: Orthopedics;  Laterality: Right;   KNEE ARTHROPLASTY Left 08/08/2021   Procedure: COMPUTER ASSISTED TOTAL KNEE ARTHROPLASTY;  Surgeon: Donato Heinz, MD;  Location: [facility];  Service: Orthopedics;  Laterality: Left;   KNEE ARTHROSCOPY  1984   Right   LASIK  2001   NASAL SINUS SURGERY  2010   left side, chronic septal perforation    Home Medications:  Allergies as of 12/11/2023       Reactions   Other Other (See Comments)   Anesthesia gases---malignant hyperthermia   Succinylcholine Other (See Comments)   malignant hyperthermia        Medication List        Accurate as of December 09, 2023  9:39 PM. If you have any questions, ask your nurse or  doctor.          amoxicillin 500 MG capsule Commonly known as: AMOXIL Take 2,000 mg by mouth See admin instructions. Take 4 capsules (2000 mg) by mouth 1 hour prior to dental appointments.   docusate sodium 100 MG capsule Commonly known as: COLACE Take 200 mg by mouth 2 (two) times daily as needed (CONSTIPATION.).   fluticasone 50 MCG/ACT nasal spray Commonly known as: FLONASE Place 2 sprays into the nose daily. What changed: how much to take   loratadine 10 MG tablet Commonly known as: CLARITIN Take 10-20 mg by mouth daily as needed for allergies.   MULTIVITAMIN ADULTS PO Take 1 packet by mouth in the morning. [employer] Vitamin pack    nystatin-triamcinolone ointment Commonly known as: MYCOLOG Apply 1 Application topically 2 (two) times daily.   OPTIVE 0.5-0.9 % ophthalmic solution Generic drug: carboxymethylcellul-glycerin Place 1-2 drops into both eyes 2 (two) times daily as needed (dry/irritated eyes.).   Synthroid 125 MCG tablet Generic drug: levothyroxine Take 1 tablet (125 mcg total) by mouth daily. Take on an empty stomach with a glass of water at least 30-60 minutes before breakfast.   traMADol 50 MG tablet Commonly known as: Ultram Take 1 tablet (50 mg total) by mouth every 6 (six) hours as needed for moderate pain.        Allergies:  Allergies  Allergen Reactions   Other Other (See Comments)    Anesthesia gases---malignant hyperthermia   Succinylcholine Other (See Comments)    malignant hyperthermia    Family History: Family History  Problem Relation Age of Onset   Hypertension Mother    Diabetes Mother    Heart disease Mother    Healthy Father    Diabetes Maternal Aunt    COPD Paternal Uncle    Coronary artery disease Paternal Uncle    Cancer Other        Both sides in smokers    Social History:  reports that he has never smoked. He has never used smokeless tobacco. He reports current alcohol use. He reports that he does not use drugs.  ROS: Pertinent ROS in HPI  Physical Exam: There were no vitals taken for this visit.  Constitutional:  Well nourished. Alert and oriented, No acute distress. HEENT: [place] AT, moist mucus membranes.  Trachea midline, no masses. Cardiovascular: No clubbing, cyanosis, or edema. Respiratory: Normal respiratory effort, no increased work of breathing. GI: Abdomen is soft, non tender, non distended, no abdominal masses. Liver and spleen not palpable.  No hernias appreciated.  Stool sample for occult testing is not indicated.   GU: No CVA tenderness.  No bladder fullness or masses.  Patient with circumcised/uncircumcised phallus. ***Foreskin easily  retracted***  Urethral meatus is patent.  No penile discharge. No penile lesions or rashes. Scrotum without lesions, cysts, rashes and/or edema.  Testicles are located scrotally bilaterally. No masses are appreciated in the testicles. Left and right epididymis are normal. Rectal: Patient with  normal sphincter tone. Anus and perineum without scarring or rashes. No rectal masses are appreciated. Prostate is approximately *** grams, *** nodules are appreciated. Seminal vesicles are normal. Skin: No rashes, bruises or suspicious lesions. Lymph: No cervical or inguinal adenopathy. Neurologic: Grossly intact, no focal deficits, moving all 4 extremities. Psychiatric: Normal mood and affect.   Laboratory Data: Results for orders placed or performed in visit on 11/26/23  PSA   Collection Time: 11/26/23  2:54 PM  Result Value Ref Range   Prostate Specific Ag, Serum 1.2  0.0 - 4.0 ng/mL  Bladder Scan (Post Void Residual) in office   Collection Time: 11/26/23  3:04 PM  Result Value Ref Range   Scan Result 0   I have reviewed the labs.  See HPI.      Pertinent Imaging: Narrative & Impression  CLINICAL DATA:  Bilateral hydroceles. Scrotal swelling and enlargement for 6 months.   EXAM: SCROTAL ULTRASOUND   DOPPLER ULTRASOUND OF THE TESTICLES   TECHNIQUE: Complete ultrasound examination of the testicles, epididymis, and other scrotal structures was performed. Color and spectral Doppler ultrasound were also utilized to evaluate blood flow to the testicles.   COMPARISON:  None Available.   FINDINGS: Right testicle   Measurements: 4.5 x 2.7 x 3.4 cm. No mass or microlithiasis visualized.   Left testicle   Measurements: 4.9 x 2.5 x 3.4 cm. Multiple cysts are seen with the largest measuring 0.9 cm. No solid testicular mass. No microlithiasis.   Right epididymis: In the expected location of the epididymal head, a multiloculated cyst is seen with the largest single cystic space measuring  7.5 x 3.2 x 3.9 cm.   Left epididymis: In the expected location of the epididymal head, a multiloculated cyst is seen with the largest single cystic space measuring 3.6 x 2.7 x 3.5 cm.   Hydrocele:  None visualized.   Varicocele:  None visualized.   Pulsed Doppler interrogation of both testes demonstrates normal low resistance arterial and venous waveforms bilaterally.   Other: No evidence of inguinal hernia on either side.   IMPRESSION: 1. Multiloculated cysts in the expected location of the epididymal heads bilaterally likely reflect spermatoceles. 2. Multiple left testicular cysts. No solid testicular mass on either side.     Electronically Signed   By: Romona Curls M.D.   On: 11/30/2023 14:12  I have independently reviewed the films.  See HPI.     Assessment & Plan:    1. Urethral stricture -Maintaining patency by cathing twice monthly  2. BPH with LUTS -PSA stable -PVR < 300 cc  -continue conservative management, avoiding bladder irritants and timed voiding's  3. Bilateral Spermatocele -Scrotal US (11/2023) - bilateral multiloculated epididymal cysts and Left testicular cysts  4. Scrotal rash -Rash may be due to the irritation of the scrotum rubbing against clothing secondary to the hydrocele -I will prescribe Mycolog cream twice daily   No follow-ups on file.  These notes generated with voice recognition software. I apologize for typographical errors.  Cloretta Ned  [facility] [address]  [address] [place], [place] [address] [phone]

## 2023-12-11 ENCOUNTER — Encounter: Payer: Self-pay | Admitting: Urology

## 2023-12-11 ENCOUNTER — Ambulatory Visit (INDEPENDENT_AMBULATORY_CARE_PROVIDER_SITE_OTHER): Payer: BLUE CROSS/BLUE SHIELD | Admitting: Urology

## 2023-12-11 VITALS — BP 139/80 | HR 52 | Ht 67.0 in | Wt 160.0 lb

## 2023-12-11 DIAGNOSIS — N35914 Unspecified anterior urethral stricture, male: Secondary | ICD-10-CM

## 2023-12-11 DIAGNOSIS — N4342 Spermatocele of epididymis, multiple: Secondary | ICD-10-CM | POA: Diagnosis not present

## 2023-12-11 DIAGNOSIS — N401 Enlarged prostate with lower urinary tract symptoms: Secondary | ICD-10-CM | POA: Diagnosis not present

## 2023-12-11 DIAGNOSIS — N138 Other obstructive and reflux uropathy: Secondary | ICD-10-CM

## 2023-12-11 DIAGNOSIS — R21 Rash and other nonspecific skin eruption: Secondary | ICD-10-CM | POA: Diagnosis not present

## 2023-12-25 ENCOUNTER — Other Ambulatory Visit: Payer: Self-pay

## 2023-12-31 ENCOUNTER — Other Ambulatory Visit: Payer: Self-pay

## 2023-12-31 MED ORDER — SYNTHROID 125 MCG PO TABS
125.0000 ug | ORAL_TABLET | Freq: Every day | ORAL | 1 refills | Status: AC
  Filled 2023-12-31 – 2024-01-09 (×2): qty 100, 100d supply, fill #0
  Filled 2024-01-13: qty 90, 90d supply, fill #0
  Filled 2024-04-21: qty 90, 90d supply, fill #1
  Filled 2024-07-13: qty 90, 90d supply, fill #2

## 2024-01-09 ENCOUNTER — Other Ambulatory Visit: Payer: Self-pay

## 2024-01-13 ENCOUNTER — Other Ambulatory Visit: Payer: Self-pay

## 2024-02-07 ENCOUNTER — Ambulatory Visit: Admitting: Urology

## 2024-02-12 ENCOUNTER — Encounter: Payer: Self-pay | Admitting: Urology

## 2024-02-12 ENCOUNTER — Ambulatory Visit (INDEPENDENT_AMBULATORY_CARE_PROVIDER_SITE_OTHER): Admitting: Urology

## 2024-02-12 VITALS — BP 129/76 | HR 52 | Ht 67.0 in | Wt 160.0 lb

## 2024-02-12 DIAGNOSIS — R21 Rash and other nonspecific skin eruption: Secondary | ICD-10-CM

## 2024-02-12 NOTE — Progress Notes (Signed)
 02/12/2024 10:06 AM   Shawn Phillips [birth year] [id number]  Referring provider: Monique Ano, MD [phone] [facility] [address] [facility] [place],  [place] [address]  Urological history: 1. Urethral stricture - Balloon dilation of the anterior urethral stricture (2021) - managed with once to twice a month self cath  2. BPH with LU TS - PSA (11/2023) 1.2  Chief Complaint  Patient presents with   Rash   HPI: Shawn Phillips is a 68 y.o. male who presents today for rash.    Previous records reviewed.     I saw him back in February where he was having a scrotal rash.  It look suspicious for tinea cruris and prescribed Mycolog cream and it resolved.   The rash returned in the early part of March and he reapplied the Mycolog and it abated again.  Then 3 weeks later it started to return.  It is located on the left hemiscrotum and left side of the penis.  It is causing him itchiness.    PMH: Past Medical History:  Diagnosis Date   Allergic rhinitis    Arthritis    right knee   Complication of anesthesia    a.) Malignant hyperthermia   Diverticulosis of colon    Dysplastic nevus 11/08/2014   L sup med scapula - mild   Hypothyroidism    Malignant hyperthermia     Surgical History: Past Surgical History:  Procedure Laterality Date   ANTERIOR CRUCIATE LIGAMENT REPAIR  1994   Left   APPLICATION OF WOUND VAC Left 08/08/2021   Procedure: APPLICATION OF WOUND VAC;  Surgeon: Arlyne Lame, MD;  Location: [facility];  Service: Orthopedics;  Laterality: Left;  [id number]   BACK SURGERY  1980   lumbar disk   BALLOON DILATION N/A 04/12/2020   Procedure: BALLOON DILATION;  Surgeon: Geraline Knapp, MD;  Location: [facility];  Service: Urology;  Laterality: N/A;   CYSTOSCOPY WITH URETHRAL DILATATION N/A 04/12/2020   Procedure: CYSTOSCOPY WITH URETHRAL DILATATION;  Surgeon: Geraline Knapp, MD;  Location: [facility];  Service: Urology;  Laterality: N/A;   KNEE ARTHROPLASTY Right  10/21/2019   Procedure: RIGHT COMPUTER ASSISTED TOTAL KNEE ARTHROPLASTY;  Surgeon: Arlyne Lame, MD;  Location: [facility];  Service: Orthopedics;  Laterality: Right;   KNEE ARTHROPLASTY Left 08/08/2021   Procedure: COMPUTER ASSISTED TOTAL KNEE ARTHROPLASTY;  Surgeon: Arlyne Lame, MD;  Location: [facility];  Service: Orthopedics;  Laterality: Left;   KNEE ARTHROSCOPY  1984   Right   LASIK  2001   NASAL SINUS SURGERY  2010   left side, chronic septal perforation    Home Medications:  Allergies as of 02/12/2024       Reactions   Other Other (See Comments)   Anesthesia gases---malignant hyperthermia   Succinylcholine Other (See Comments)   malignant hyperthermia        Medication List        Accurate as of Feb 12, 2024 10:06 AM. If you have any questions, ask your nurse or doctor.          amoxicillin 500 MG capsule Commonly known as: AMOXIL Take 2,000 mg by mouth See admin instructions. Take 4 capsules (2000 mg) by mouth 1 hour prior to dental appointments.   docusate sodium 100 MG capsule Commonly known as: COLACE Take 200 mg by mouth 2 (two) times daily as needed (CONSTIPATION.).   fluticasone  50 MCG/ACT nasal spray Commonly known as: FLONASE  Place 2 sprays into  the nose daily. What changed: how much to take   loratadine  10 MG tablet Commonly known as: CLARITIN  Take 10-20 mg by mouth daily as needed for allergies.   MULTIVITAMIN ADULTS PO Take 1 packet by mouth in the morning. [employer] Vitamin pack   nystatin -triamcinolone  ointment Commonly known as: MYCOLOG Apply 1 Application topically 2 (two) times daily.   OPTIVE 0.5-0.9 % ophthalmic solution Generic drug: carboxymethylcellul-glycerin  Place 1-2 drops into both eyes 2 (two) times daily as needed (dry/irritated eyes.).   Synthroid  125 MCG tablet Generic drug: levothyroxine  Take 1 tablet (125 mcg total) by mouth daily. Take on an empty stomach with a glass of water at least 30-60 minutes before breakfast.    traMADol  50 MG tablet Commonly known as: Ultram  Take 1 tablet (50 mg total) by mouth every 6 (six) hours as needed for moderate pain.        Allergies:  Allergies  Allergen Reactions   Other Other (See Comments)    Anesthesia gases---malignant hyperthermia   Succinylcholine Other (See Comments)    malignant hyperthermia    Family History: Family History  Problem Relation Age of Onset   Hypertension Mother    Diabetes Mother    Heart disease Mother    Healthy Father    Diabetes Maternal Aunt    COPD Paternal Uncle    Coronary artery disease Paternal Uncle    Cancer Other        Both sides in smokers    Social History:  reports that he has never smoked. He has never used smokeless tobacco. He reports current alcohol  use. He reports that he does not use drugs.  ROS: Pertinent ROS in HPI  Physical Exam: BP 129/76   Pulse (!) 52   Ht 5\' 7"  (1.702 m)   Wt 160 lb (72.6 kg)   BMI 25.06 kg/m   Constitutional:  Well nourished. Alert and oriented, No acute distress. HEENT: [place] AT, moist mucus membranes.  Trachea midline Cardiovascular: No clubbing, cyanosis, or edema. Respiratory: Normal respiratory effort, no increased work of breathing. GU: No CVA tenderness.  No bladder fullness or masses.  Patient with circumcised phallus.   Urethral meatus is patent.  No penile discharge. No penile lesions or rashes.  Scrotum without lesions, cysts and/or edema.  Testicles are located scrotally bilaterally. No masses are appreciated in the testicles. Left and right epididymis are normal.  There is a fine sandpapery rash on the left hemiscrotum.  No crepitus, erythema or fluctuant masses noted. Neurologic: Grossly intact, no focal deficits, moving all 4 extremities. Psychiatric: Normal mood and affect.   Laboratory Data: N/A  Pertinent Imaging: N/A   Assessment & Plan:    1. Scrotal rash - Patient does have a dermatologist, Dr. Bary Likes, and is scheduled to see him in June.  We  discussed that at the visit he notify Dr. Bary Likes of the rash in the scrotum so that he could give his opinion.  In the meantime, he will not use any Mycolog cream unless the rash continues to increase in size or becomes more irritating - After his visit with Dr. Bary Likes, he will notify me via MyChart on his findings and recommendations  Return for Keep scheduled follow up .  These notes generated with voice recognition software. I apologize for typographical errors.  Briant Camper  [facility] [address]  [address] [place], [place] [address] [phone]

## 2024-03-11 ENCOUNTER — Ambulatory Visit: Payer: BLUE CROSS/BLUE SHIELD | Admitting: Dermatology

## 2024-03-11 ENCOUNTER — Other Ambulatory Visit: Payer: Self-pay

## 2024-03-11 ENCOUNTER — Encounter: Payer: Self-pay | Admitting: Dermatology

## 2024-03-11 DIAGNOSIS — Z1283 Encounter for screening for malignant neoplasm of skin: Secondary | ICD-10-CM

## 2024-03-11 DIAGNOSIS — L918 Other hypertrophic disorders of the skin: Secondary | ICD-10-CM

## 2024-03-11 DIAGNOSIS — R21 Rash and other nonspecific skin eruption: Secondary | ICD-10-CM | POA: Diagnosis not present

## 2024-03-11 DIAGNOSIS — W908XXA Exposure to other nonionizing radiation, initial encounter: Secondary | ICD-10-CM

## 2024-03-11 DIAGNOSIS — L570 Actinic keratosis: Secondary | ICD-10-CM

## 2024-03-11 DIAGNOSIS — L578 Other skin changes due to chronic exposure to nonionizing radiation: Secondary | ICD-10-CM

## 2024-03-11 DIAGNOSIS — L820 Inflamed seborrheic keratosis: Secondary | ICD-10-CM

## 2024-03-11 DIAGNOSIS — D229 Melanocytic nevi, unspecified: Secondary | ICD-10-CM

## 2024-03-11 DIAGNOSIS — Z79899 Other long term (current) drug therapy: Secondary | ICD-10-CM

## 2024-03-11 DIAGNOSIS — D492 Neoplasm of unspecified behavior of bone, soft tissue, and skin: Secondary | ICD-10-CM | POA: Diagnosis not present

## 2024-03-11 DIAGNOSIS — L739 Follicular disorder, unspecified: Secondary | ICD-10-CM

## 2024-03-11 DIAGNOSIS — B372 Candidiasis of skin and nail: Secondary | ICD-10-CM

## 2024-03-11 DIAGNOSIS — Z7189 Other specified counseling: Secondary | ICD-10-CM

## 2024-03-11 DIAGNOSIS — D1801 Hemangioma of skin and subcutaneous tissue: Secondary | ICD-10-CM

## 2024-03-11 DIAGNOSIS — L814 Other melanin hyperpigmentation: Secondary | ICD-10-CM

## 2024-03-11 DIAGNOSIS — L821 Other seborrheic keratosis: Secondary | ICD-10-CM

## 2024-03-11 DIAGNOSIS — B3789 Other sites of candidiasis: Secondary | ICD-10-CM

## 2024-03-11 MED ORDER — KETOCONAZOLE 2 % EX CREA
1.0000 | TOPICAL_CREAM | Freq: Two times a day (BID) | CUTANEOUS | 5 refills | Status: AC | PRN
  Filled 2024-03-11: qty 30, 30d supply, fill #0

## 2024-03-11 MED ORDER — MUPIROCIN 2 % EX OINT
TOPICAL_OINTMENT | CUTANEOUS | 1 refills | Status: AC
  Filled 2024-03-11: qty 22, 30d supply, fill #0

## 2024-03-11 NOTE — Patient Instructions (Addendum)
 Cryotherapy Aftercare  Wash gently with soap and water everyday.   Apply Vaseline and Band-Aid daily until healed.     Apply Ketoconazole cream twice a day to groin area as needed for rash.  Mupirocin Apply twice daily to affected area at nose, apply once daily to biopsy site with bandage change   Wound Care Instructions  Cleanse wound gently with soap and water once a day then pat dry with clean gauze. Apply a thin coat of Petrolatum (petroleum jelly, Vaseline) over the wound (unless you have an allergy to this). We recommend that you use a new, sterile tube of Vaseline. Do not pick or remove scabs. Do not remove the yellow or white healing tissue from the base of the wound.  Cover the wound with fresh, clean, nonstick gauze and secure with paper tape. You may use Band-Aids in place of gauze and tape if the wound is small enough, but would recommend trimming much of the tape off as there is often too much. Sometimes Band-Aids can irritate the skin.  You should call the office for your biopsy report after 1 week if you have not already been contacted.  If you experience any problems, such as abnormal amounts of bleeding, swelling, significant bruising, significant pain, or evidence of infection, please call the office immediately.  FOR ADULT SURGERY PATIENTS: If you need something for pain relief you may take 1 extra strength Tylenol  (acetaminophen ) AND 2 Ibuprofen (200mg  each) together every 4 hours as needed for pain. (do not take these if you are allergic to them or if you have a reason you should not take them.) Typically, you may only need pain medication for 1 to 3 days.      Recommend daily broad spectrum sunscreen SPF 30+ to sun-exposed areas, reapply every 2 hours as needed. Call for new or changing lesions.  Staying in the shade or wearing long sleeves, sun glasses (UVA+UVB protection) and wide brim hats (4-inch brim around the entire circumference of the hat) are also  recommended for sun protection.    Melanoma ABCDEs  Melanoma is the most dangerous type of skin cancer, and is the leading cause of death from skin disease.  You are more likely to develop melanoma if you: Have light-colored skin, light-colored eyes, or red or blond hair Spend a lot of time in the sun Tan regularly, either outdoors or in a tanning bed Have had blistering sunburns, especially during childhood Have a close family member who has had a melanoma Have atypical moles or large birthmarks  Early detection of melanoma is key since treatment is typically straightforward and cure rates are extremely high if we catch it early.   The first sign of melanoma is often a change in a mole or a new dark spot.  The ABCDE system is a way of remembering the signs of melanoma.  A for asymmetry:  The two halves do not match. B for border:  The edges of the growth are irregular. C for color:  A mixture of colors are present instead of an even brown color. D for diameter:  Melanomas are usually (but not always) greater than 6mm - the size of a pencil eraser. E for evolution:  The spot keeps changing in size, shape, and color.  Please check your skin once per month between visits. You can use a small mirror in front and a large mirror behind you to keep an eye on the back side or your body.   If  you see any new or changing lesions before your next follow-up, please call to schedule a visit.  Please continue daily skin protection including broad spectrum sunscreen SPF 30+ to sun-exposed areas, reapplying every 2 hours as needed when you're outdoors.   Staying in the shade or wearing long sleeves, sun glasses (UVA+UVB protection) and wide brim hats (4-inch brim around the entire circumference of the hat) are also recommended for sun protection.      Due to recent changes in healthcare laws, you may see results of your pathology and/or laboratory studies on MyChart before the doctors have had a  chance to review them. We understand that in some cases there may be results that are confusing or concerning to you. Please understand that not all results are received at the same time and often the doctors may need to interpret multiple results in order to provide you with the best plan of care or course of treatment. Therefore, we ask that you please give us  2 business days to thoroughly review all your results before contacting the office for clarification. Should we see a critical lab result, you will be contacted sooner.   If You Need Anything After Your Visit  If you have any questions or concerns for your doctor, please call our main line at [phone] and press option 4 to reach your doctor's medical assistant. If no one answers, please leave a voicemail as directed and we will return your call as soon as possible. Messages left after 4 pm will be answered the following business day.   You may also send us  a message via MyChart. We typically respond to MyChart messages within 1-2 business days.  For prescription refills, please ask your pharmacy to contact our office. Our fax number is [phone].  If you have an urgent issue when the clinic is closed that cannot wait until the next business day, you can page your doctor at the number below.    Please note that while we do our best to be available for urgent issues outside of office hours, we are not available 24/7.   If you have an urgent issue and are unable to reach us , you may choose to seek medical care at your doctor's office, retail clinic, urgent care center, or emergency room.  If you have a medical emergency, please immediately call 911 or go to the emergency department.  Pager Numbers  - Dr. Bary Likes: [phone]  - Dr. Annette Barters: [phone]  - Dr. Felipe Horton: [phone]   In the event of inclement weather, please call our main line at [phone] for an update on the status of any delays or closures.  Dermatology  Medication Tips: Please keep the boxes that topical medications come in in order to help keep track of the instructions about where and how to use these. Pharmacies typically print the medication instructions only on the boxes and not directly on the medication tubes.   If your medication is too expensive, please contact our office at [phone] option 4 or send us  a message through MyChart.   We are unable to tell what your co-pay for medications will be in advance as this is different depending on your insurance coverage. However, we may be able to find a substitute medication at lower cost or fill out paperwork to get insurance to cover a needed medication.   If a prior authorization is required to get your medication covered by your insurance company, please allow us  1-2 business days to complete this  process.  Drug prices often vary depending on where the prescription is filled and some pharmacies may offer cheaper prices.  The website www.goodrx.com contains coupons for medications through different pharmacies. The prices here do not account for what the cost may be with help from insurance (it may be cheaper with your insurance), but the website can give you the price if you did not use any insurance.  - You can print the associated coupon and take it with your prescription to the pharmacy.  - You may also stop by our office during regular business hours and pick up a GoodRx coupon card.  - If you need your prescription sent electronically to a different pharmacy, notify our office through [facility] or by phone at [phone] option 4.     Si Usted Necesita Algo Despus de Su Visita  Tambin puede enviarnos un mensaje a travs de Clinical cytogeneticist. Por lo general respondemos a los mensajes de MyChart en el transcurso de 1 a 2 das hbiles.  Para renovar recetas, por favor pida a su farmacia que se ponga en contacto con nuestra oficina. Franz Jacks de fax es [place] [phone].  Si  tiene un asunto urgente cuando la clnica est cerrada y que no puede esperar hasta el siguiente da hbil, puede llamar/localizar a su doctor(a) al nmero que aparece a continuacin.   Por favor, tenga en cuenta que aunque hacemos todo lo posible para estar disponibles para asuntos urgentes fuera del horario de [place], no estamos disponibles las 24 horas del da, los 7 [address]  [address] de la [place].   Si tiene un problema urgente y no puede comunicarse con nosotros, puede optar por buscar atencin mdica  en el consultorio de su doctor(a), en una clnica privada, en un centro de atencin urgente o en una sala de emergencias.  Si tiene Engineer, drilling, por favor llame inmediatamente al 911 o vaya a la sala de emergencias.  Nmeros de bper  - Dr. Bary Likes: [phone]  - Dra. Annette Barters: [phone]  - Dr. Felipe Horton: [phone]   En caso de inclemencias del tiempo, por favor llame a Lajuan Pila principal al [phone] para una actualizacin sobre el [place] de cualquier retraso o cierre.  Consejos para la medicacin en dermatologa: Por favor, guarde las cajas en las que vienen los medicamentos de uso tpico para ayudarle a seguir las instrucciones sobre dnde y cmo usarlos. Las farmacias generalmente imprimen las instrucciones del medicamento slo en las cajas y no directamente en los tubos del [place].   Si su medicamento es muy caro, por favor, pngase en contacto con Bettyjane Brunet llamando al [phone] y presione la opcin 4 o envenos un mensaje a travs de Clinical cytogeneticist.   No podemos decirle cul ser su copago por los medicamentos por adelantado ya que esto es diferente dependiendo de la cobertura de su seguro. Sin embargo, es posible que podamos encontrar un medicamento sustituto a Audiological scientist un formulario para que el seguro cubra el medicamento que se considera necesario.   Si se requiere una autorizacin previa para que su compaa de seguros [place] su medicamento, por  favor permtanos de 1 a 2 das hbiles para completar este proceso.  Los precios de los medicamentos varan con frecuencia dependiendo del Environmental consultant de dnde se surte la receta y alguna farmacias pueden ofrecer precios ms baratos.  El sitio web www.goodrx.com tiene cupones para medicamentos de Health and safety inspector. Los precios aqu no tienen en cuenta lo que podra costar con la ayuda del seguro (  puede ser ms barato con su seguro), pero el sitio web puede darle el precio si no Visual merchandiser.  - Puede imprimir el cupn correspondiente y llevarlo con su receta a la farmacia.  - Tambin puede pasar por nuestra oficina durante el horario de atencin regular y Education officer, museum una tarjeta de cupones de GoodRx.  - Si necesita que su receta se enve electrnicamente a una farmacia diferente, informe a nuestra oficina a travs de MyChart de [place] o por telfono llamando al [phone] y presione la opcin 4.

## 2024-03-11 NOTE — Progress Notes (Unsigned)
 Follow-Up Visit   Subjective  Shawn Phillips is a 67 y.o. male who presents for the following: Skin Cancer Screening and Full Body Skin Exam. Hx of DN. Hx of AKs. No personal Hx of skin cancer.   Scaly lesion at upper forehead. Dx as ISK last visit. Was treated with LN2 last year. Has recurred. Spot at left upper cutaneous lip near nose. Thinks has an ingrown hair.   Irritated lesion below chin and right chest area. Rubbed, irritated.   Check rash on left scrotum. Urologist prescribed Triamcinolone -Nystatin  ointment. Clears completely then recurs in about a week.   The patient presents for Total-Body Skin Exam (TBSE) for skin cancer screening and mole check. The patient has spots, moles and lesions to be evaluated, some may be new or changing and the patient may have concern these could be cancer.  The following portions of the chart were reviewed this encounter and updated as appropriate: medications, allergies, medical history  Review of Systems:  No other skin or systemic complaints except as noted in HPI or Assessment and Plan.  Objective  Well appearing patient in no apparent distress; mood and affect are within normal limits.  A full examination was performed including scalp, head, eyes, ears, nose, lips, neck, chest, axillae, abdomen, back, buttocks, bilateral upper extremities, bilateral lower extremities, hands, feet, fingers, toes, fingernails, and toenails. All findings within normal limits unless otherwise noted below.   Relevant physical exam findings are noted in the Assessment and Plan.  upper mid forehead at hair line 0.7 erythematous keratotic papule Left Ear x1, Scalp x2, R ear x1 (4) Erythematous thin papules/macules with gritty scale.  Mid Submental Area x1, R costal area x1 (2) Fleshy, erythematous pedunculated papules.   left scrotum Erythematous patch  Assessment & Plan   SKIN CANCER SCREENING PERFORMED TODAY.  ACTINIC DAMAGE - Chronic condition,  secondary to cumulative UV/sun exposure - diffuse scaly erythematous macules with underlying dyspigmentation - Recommend daily broad spectrum sunscreen SPF 30+ to sun-exposed areas, reapply every 2 hours as needed.  - Staying in the shade or wearing long sleeves, sun glasses (UVA+UVB protection) and wide brim hats (4-inch brim around the entire circumference of the hat) are also recommended for sun protection.  - Call for new or changing lesions.  LENTIGINES, SEBORRHEIC KERATOSES, HEMANGIOMAS - Benign normal skin lesions - Benign-appearing - Call for any changes  MELANOCYTIC NEVI - Tan-brown and/or pink-flesh-colored symmetric macules and papules - Benign appearing on exam today - Observation - Call clinic for new or changing moles - Recommend daily use of broad spectrum spf 30+ sunscreen to sun-exposed areas.   FOLLICULITIS Exam: Perifollicular erythematous pustular papule at left infranasal opening. Folliculitis occurs due to inflammation of the superficial hair follicle (pore), resulting in acne-like lesions (pus bumps). It can be infectious (bacterial, fungal) or noninfectious (shaving, tight clothing, heat/sweat, medications).  Folliculitis can be acute or chronic and recommended treatment depends on the underlying cause of folliculitis. Treatment Plan: Mupirocin Apply twice daily to affected area at nose  NEOPLASM OF SKIN upper mid forehead at hair line Epidermal / dermal shaving  Lesion diameter (cm):  0.7 Informed consent: discussed and consent obtained   Timeout: patient name, date of birth, surgical site, and procedure verified   Procedure prep:  Patient was prepped and draped in usual sterile fashion Prep type:  Isopropyl alcohol  Anesthesia: the lesion was anesthetized in a standard fashion   Anesthetic:  1% lidocaine  w/ epinephrine 1-100,000 buffered w/ 8.4% NaHCO3 Instrument  used: flexible razor blade   Hemostasis achieved with: pressure, aluminum chloride and  electrodesiccation   Outcome: patient tolerated procedure well   Post-procedure details: sterile dressing applied and wound care instructions given   Dressing type: bandage and petrolatum    Destruction of lesion Complexity: extensive   Destruction method: electrodesiccation and curettage   Informed consent: discussed and consent obtained   Timeout:  patient name, date of birth, surgical site, and procedure verified Procedure prep:  Patient was prepped and draped in usual sterile fashion Prep type:  Isopropyl alcohol  Anesthesia: the lesion was anesthetized in a standard fashion   Anesthetic:  1% lidocaine  w/ epinephrine 1-100,000 buffered w/ 8.4% NaHCO3 Curettage performed in three different directions: Yes   Electrodesiccation performed over the curetted area: Yes   Curettage cycles:  3 Final wound size (cm):  0.7 Hemostasis achieved with:  pressure and aluminum chloride Outcome: patient tolerated procedure well with no complications   Post-procedure details: sterile dressing applied and wound care instructions given   Dressing type: bandage and petrolatum   Specimen 1 - Surgical pathology Differential Diagnosis: ISK, R/O SCC  Check Margins: No AK (ACTINIC KERATOSIS) (4) Left Ear x1, Scalp x2, R ear x1 (4) Actinic keratoses are precancerous spots that appear secondary to cumulative UV radiation exposure/sun exposure over time. They are chronic with expected duration over 1 year. A portion of actinic keratoses will progress to squamous cell carcinoma of the skin. It is not possible to reliably predict which spots will progress to skin cancer and so treatment is recommended to prevent development of skin cancer.  Recommend daily broad spectrum sunscreen SPF 30+ to sun-exposed areas, reapply every 2 hours as needed.  Recommend staying in the shade or wearing long sleeves, sun glasses (UVA+UVB protection) and wide brim hats (4-inch brim around the entire circumference of the hat). Call  for new or changing lesions. Destruction of lesion - Left Ear x1, Scalp x2, R ear x1 (4) Complexity: simple   Destruction method: cryotherapy   Informed consent: discussed and consent obtained   Timeout:  patient name, date of birth, surgical site, and procedure verified Lesion destroyed using liquid nitrogen: Yes   Region frozen until ice ball extended beyond lesion: Yes   Outcome: patient tolerated procedure well with no complications   Post-procedure details: wound care instructions given   Additional details:  Prior to procedure, discussed risks of blister formation, small wound, skin dyspigmentation, or rare scar following cryotherapy. Recommend Vaseline ointment to treated areas while healing.  INFLAMED ACROCHORDON (2) Mid Submental Area x1, R costal area x1 (2) Destruction of lesion - Mid Submental Area x1, R costal area x1 (2) Complexity: simple   Destruction method: cryotherapy   Informed consent: discussed and consent obtained   Timeout:  patient name, date of birth, surgical site, and procedure verified Lesion destroyed using liquid nitrogen: Yes   Region frozen until ice ball extended beyond lesion: Yes   Outcome: patient tolerated procedure well with no complications   Post-procedure details: wound care instructions given   Additional details:  Prior to procedure, discussed risks of blister formation, small wound, skin dyspigmentation, or rare scar following cryotherapy. Recommend Vaseline ointment to treated areas while healing.  CANDIDA RASH OF GROIN left scrotum Apply Ketoconazole cream twice a day to groin area as needed for rash. SKIN CANCER SCREENING   ACTINIC SKIN DAMAGE   LENTIGO   MELANOCYTIC NEVUS, UNSPECIFIED LOCATION   FOLLICULITIS   Return in about 1 year (around 03/11/2025)  for TBSE, HxDN.  I, Jill Parcell, CMA, am acting as scribe for Celine Collard, MD.   Documentation: I have reviewed the above documentation for accuracy and completeness, and  I agree with the above.  Celine Collard, MD

## 2024-03-12 LAB — SURGICAL PATHOLOGY

## 2024-03-13 ENCOUNTER — Ambulatory Visit: Payer: Self-pay | Admitting: Dermatology

## 2024-03-16 NOTE — Telephone Encounter (Signed)
 Patient informed of pathology results

## 2024-03-16 NOTE — Telephone Encounter (Signed)
-----   Message from Celine Collard sent at 03/13/2024  2:39 PM EDT ----- FINAL DIAGNOSIS        1. Skin, upper mid forehead at hair line :       SEBORRHEIC KERATOSIS, INFLAMED   Benign inflamed keratosis No further treatment needed ----- Message ----- From: Interface, Lab In [facility] Sent: 03/12/2024   5:52 PM EDT To: Elta Halter, MD

## 2024-04-22 ENCOUNTER — Other Ambulatory Visit: Payer: Self-pay

## 2024-06-24 ENCOUNTER — Other Ambulatory Visit: Payer: Self-pay

## 2024-06-24 MED ORDER — AMOXICILLIN 500 MG PO CAPS
2000.0000 mg | ORAL_CAPSULE | ORAL | 0 refills | Status: AC
  Filled 2024-06-24: qty 12, 3d supply, fill #0
  Filled 2024-07-09: qty 12, 4d supply, fill #0

## 2024-07-06 ENCOUNTER — Other Ambulatory Visit: Payer: Self-pay

## 2024-07-09 ENCOUNTER — Other Ambulatory Visit: Payer: Self-pay

## 2024-07-14 ENCOUNTER — Other Ambulatory Visit: Payer: Self-pay

## 2024-07-14 MED ORDER — SYNTHROID 125 MCG PO TABS
125.0000 ug | ORAL_TABLET | Freq: Every day | ORAL | 1 refills | Status: DC
  Filled 2024-07-14: qty 90, 90d supply, fill #0

## 2024-10-12 ENCOUNTER — Other Ambulatory Visit: Payer: Self-pay

## 2024-10-12 MED ORDER — LEVOTHYROXINE SODIUM 137 MCG PO TABS
137.0000 ug | ORAL_TABLET | Freq: Every day | ORAL | 1 refills | Status: AC
  Filled 2024-10-12 – 2024-10-16 (×2): qty 90, 90d supply, fill #0

## 2024-10-16 ENCOUNTER — Other Ambulatory Visit: Payer: Self-pay

## 2024-10-16 MED ORDER — FLUZONE HIGH-DOSE 0.5 ML IM SUSY
0.5000 mL | PREFILLED_SYRINGE | Freq: Once | INTRAMUSCULAR | 0 refills | Status: AC
  Filled 2024-10-16: qty 0.5, 1d supply, fill #0

## 2024-12-09 ENCOUNTER — Other Ambulatory Visit

## 2024-12-16 ENCOUNTER — Ambulatory Visit: Admitting: Urology

## 2025-03-17 ENCOUNTER — Ambulatory Visit: Admitting: Dermatology
